# Patient Record
Sex: Male | Born: 1950
Health system: Southern US, Community
[De-identification: ages and names within clinical notes are randomized; demographics above are authoritative.]

## PROBLEM LIST (undated history)

## (undated) DIAGNOSIS — C801 Malignant (primary) neoplasm, unspecified: Secondary | ICD-10-CM

## (undated) DIAGNOSIS — I10 Essential (primary) hypertension: Secondary | ICD-10-CM

## (undated) DIAGNOSIS — R7303 Prediabetes: Secondary | ICD-10-CM

## (undated) DIAGNOSIS — Z8489 Family history of other specified conditions: Secondary | ICD-10-CM

## (undated) DIAGNOSIS — K219 Gastro-esophageal reflux disease without esophagitis: Secondary | ICD-10-CM

## (undated) DIAGNOSIS — J45909 Unspecified asthma, uncomplicated: Secondary | ICD-10-CM

## (undated) HISTORY — PX: COLONOSCOPY: SHX174

## (undated) HISTORY — PX: NASAL SINUS SURGERY: SHX719

---

## 1999-03-31 ENCOUNTER — Other Ambulatory Visit: Admission: RE | Admit: 1999-03-31 | Discharge: 1999-03-31 | Payer: Self-pay | Admitting: *Deleted

## 2011-05-25 ENCOUNTER — Other Ambulatory Visit: Payer: Self-pay | Admitting: Otolaryngology

## 2011-05-25 DIAGNOSIS — J329 Chronic sinusitis, unspecified: Secondary | ICD-10-CM

## 2011-06-01 ENCOUNTER — Ambulatory Visit
Admission: RE | Admit: 2011-06-01 | Discharge: 2011-06-01 | Disposition: A | Discharge status: Home / Self Care | Payer: Managed Care, Other (non HMO) | Source: Ambulatory Visit | Attending: Otolaryngology | Admitting: Otolaryngology

## 2011-06-01 DIAGNOSIS — J329 Chronic sinusitis, unspecified: Secondary | ICD-10-CM

## 2011-06-05 ENCOUNTER — Other Ambulatory Visit: Payer: Self-pay | Admitting: Otolaryngology

## 2012-02-03 HISTORY — PX: BUNIONECTOMY: SHX129

## 2013-12-17 IMAGING — CT CT PARANASAL SINUSES LIMITED
1 series · 10 of 12 positions shown, 13 images · non-contrast
Comparison: 02/25/2006.

CLINICAL DATA: History of ear infection, laryngitis, congestion
and drainage.

CT LIMITED SINUSES WITHOUT CONTRAST
TECHNIQUE: Multidetector CT images of the paranasal sinuses were
obtained in a single plane without contrast.

[Series 3: coronal soft · axial · 0.33mm/px · z∈[+13,+103]mm · 10 of 12 slices shown, 13 images]
[im 2/12  brain]
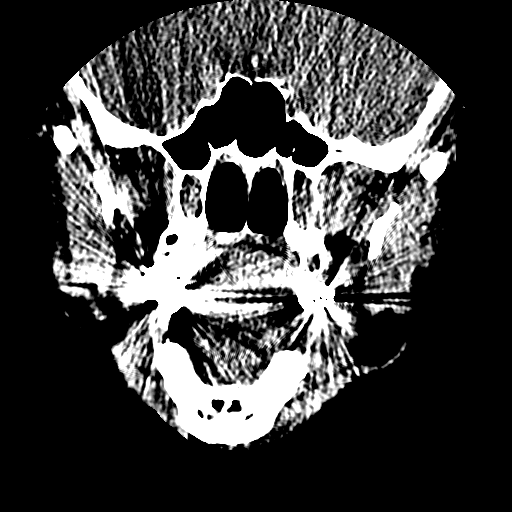
[im 2/12  bone]
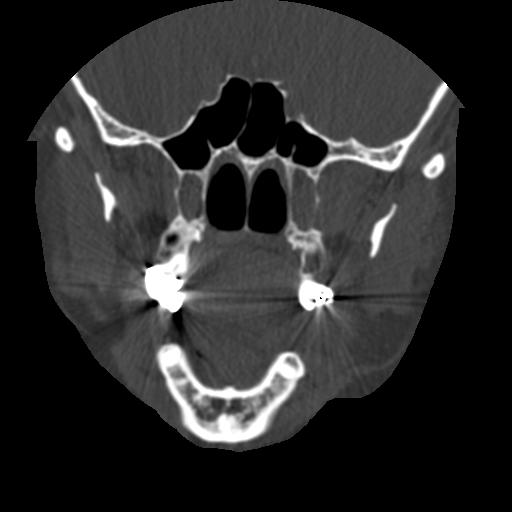
[im 3/12  bone]
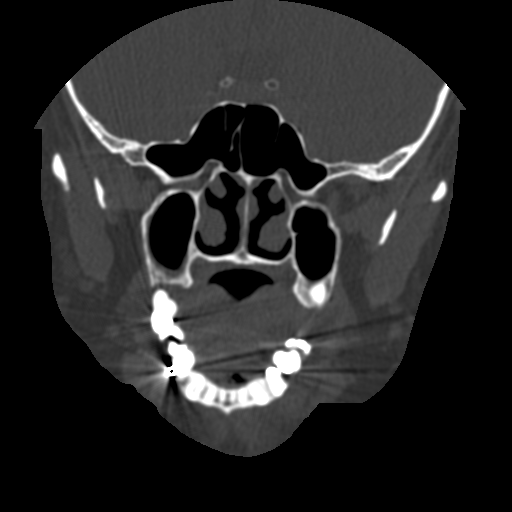
[im 4/12  bone]
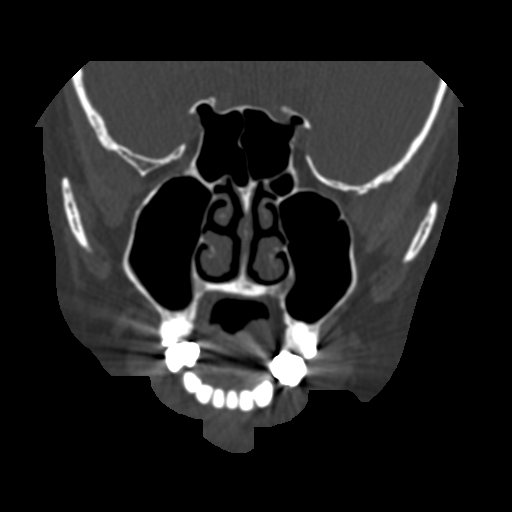
[im 5/12  bone]
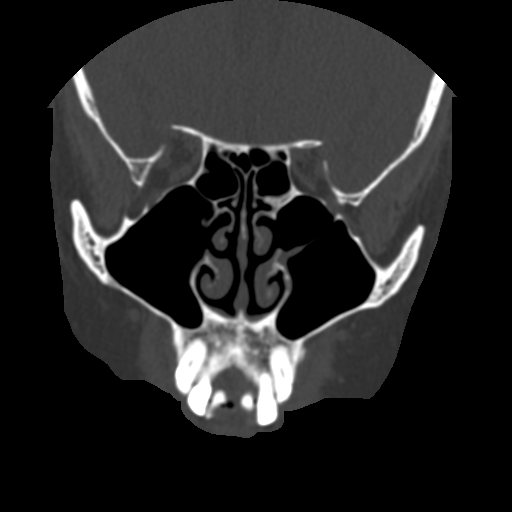
[im 6/12  brain]
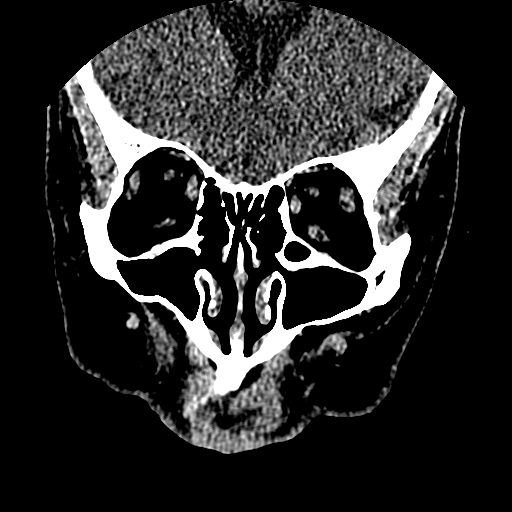
[im 6/12  bone]
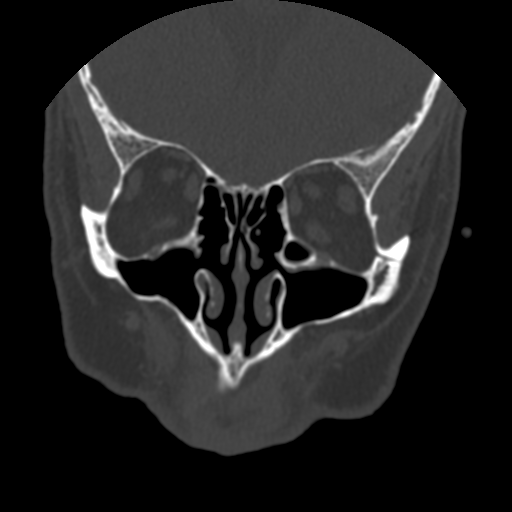
[im 7/12  bone]
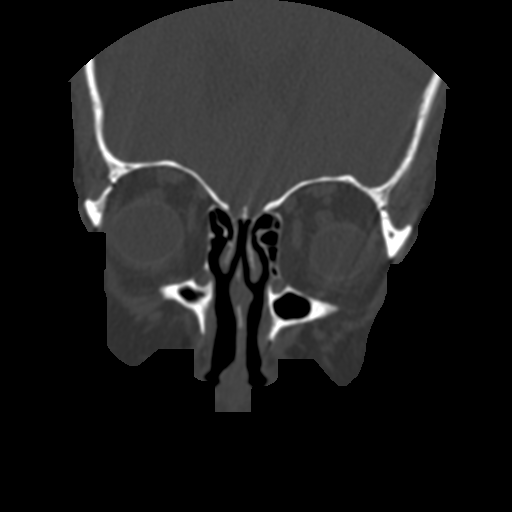
[im 8/12  bone]
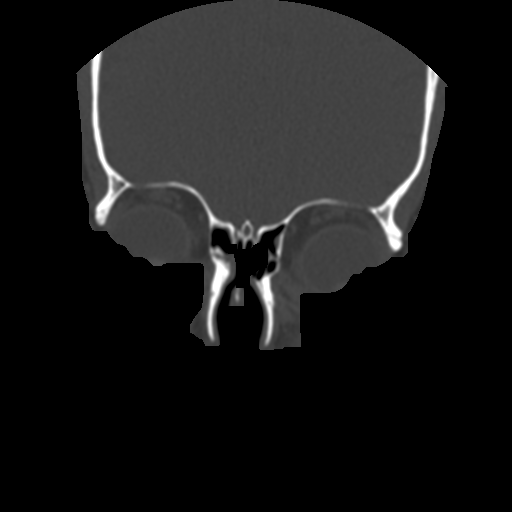
[im 9/12  bone]
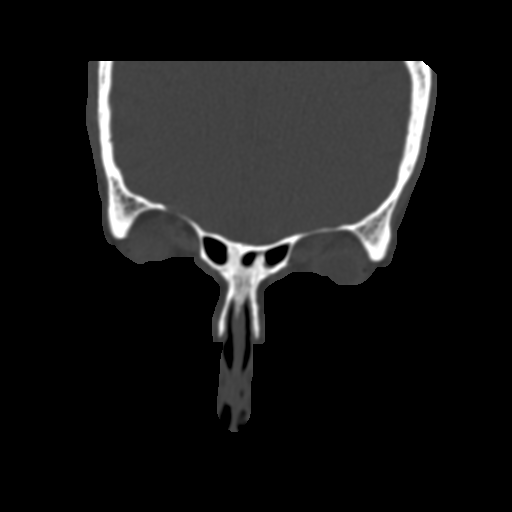
[im 10/12  brain]
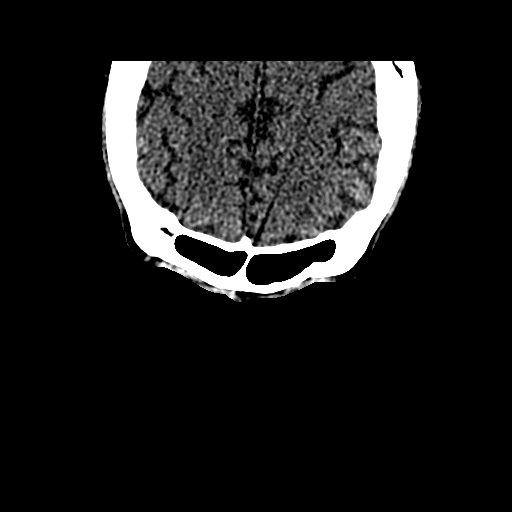
[im 10/12  bone]
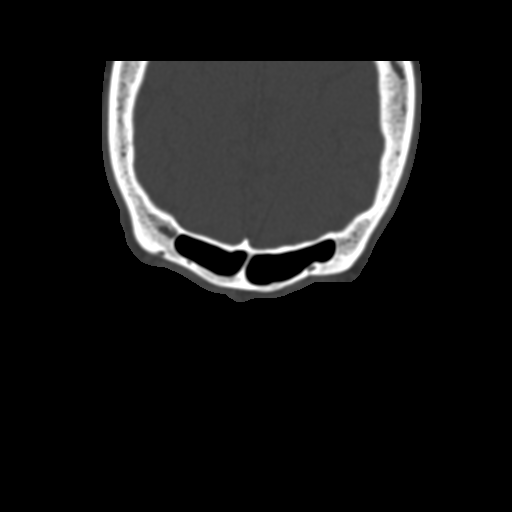
[im 11/12  bone]
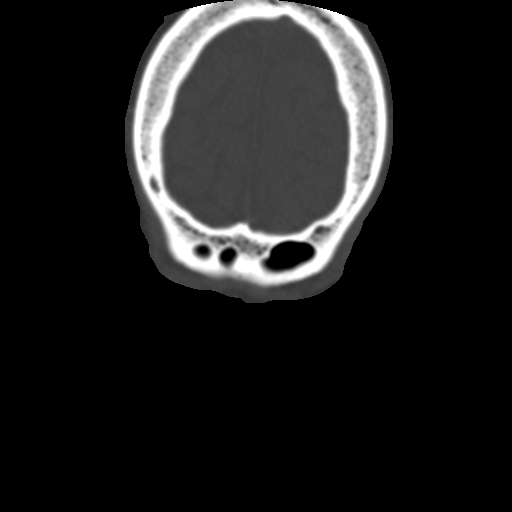

[10 of 12 positions shown; findings below may reference images not displayed]

FINDINGS: Trace mucosal thickening in the ethmoid air cells and
maxillary sinuses.  No air fluid levels.  Incidental imaging of the
intracranial contents shows no acute findings.
IMPRESSION: Trace mucosal thickening scattered in the ethmoid air cells and
maxillary sinuses.  No air fluid levels.

## 2017-06-02 DIAGNOSIS — L820 Inflamed seborrheic keratosis: Secondary | ICD-10-CM | POA: Diagnosis not present

## 2017-06-02 DIAGNOSIS — D485 Neoplasm of uncertain behavior of skin: Secondary | ICD-10-CM | POA: Diagnosis not present

## 2017-06-02 DIAGNOSIS — D225 Melanocytic nevi of trunk: Secondary | ICD-10-CM | POA: Diagnosis not present

## 2017-06-02 DIAGNOSIS — L578 Other skin changes due to chronic exposure to nonionizing radiation: Secondary | ICD-10-CM | POA: Diagnosis not present

## 2017-06-02 DIAGNOSIS — L821 Other seborrheic keratosis: Secondary | ICD-10-CM | POA: Diagnosis not present

## 2017-09-14 DIAGNOSIS — J329 Chronic sinusitis, unspecified: Secondary | ICD-10-CM | POA: Diagnosis not present

## 2017-09-14 DIAGNOSIS — J309 Allergic rhinitis, unspecified: Secondary | ICD-10-CM | POA: Diagnosis not present

## 2017-09-15 DIAGNOSIS — L720 Epidermal cyst: Secondary | ICD-10-CM | POA: Diagnosis not present

## 2017-10-25 DIAGNOSIS — M21622 Bunionette of left foot: Secondary | ICD-10-CM | POA: Diagnosis not present

## 2017-10-25 DIAGNOSIS — E139 Other specified diabetes mellitus without complications: Secondary | ICD-10-CM | POA: Diagnosis not present

## 2017-10-25 DIAGNOSIS — D2372 Other benign neoplasm of skin of left lower limb, including hip: Secondary | ICD-10-CM | POA: Diagnosis not present

## 2017-10-25 DIAGNOSIS — M21962 Unspecified acquired deformity of left lower leg: Secondary | ICD-10-CM | POA: Diagnosis not present

## 2017-11-08 DIAGNOSIS — K219 Gastro-esophageal reflux disease without esophagitis: Secondary | ICD-10-CM | POA: Diagnosis not present

## 2017-12-03 DIAGNOSIS — J45909 Unspecified asthma, uncomplicated: Secondary | ICD-10-CM | POA: Diagnosis not present

## 2017-12-03 DIAGNOSIS — E119 Type 2 diabetes mellitus without complications: Secondary | ICD-10-CM | POA: Diagnosis not present

## 2017-12-03 DIAGNOSIS — Z86010 Personal history of colonic polyps: Secondary | ICD-10-CM | POA: Diagnosis not present

## 2017-12-03 DIAGNOSIS — Z800 Family history of malignant neoplasm of digestive organs: Secondary | ICD-10-CM | POA: Diagnosis not present

## 2017-12-03 DIAGNOSIS — Z1211 Encounter for screening for malignant neoplasm of colon: Secondary | ICD-10-CM | POA: Diagnosis not present

## 2017-12-03 DIAGNOSIS — K219 Gastro-esophageal reflux disease without esophagitis: Secondary | ICD-10-CM | POA: Diagnosis not present

## 2017-12-03 DIAGNOSIS — K635 Polyp of colon: Secondary | ICD-10-CM | POA: Diagnosis not present

## 2017-12-03 DIAGNOSIS — D124 Benign neoplasm of descending colon: Secondary | ICD-10-CM | POA: Diagnosis not present

## 2017-12-03 DIAGNOSIS — D122 Benign neoplasm of ascending colon: Secondary | ICD-10-CM | POA: Diagnosis not present

## 2017-12-03 DIAGNOSIS — I10 Essential (primary) hypertension: Secondary | ICD-10-CM | POA: Diagnosis not present

## 2017-12-03 DIAGNOSIS — M1990 Unspecified osteoarthritis, unspecified site: Secondary | ICD-10-CM | POA: Diagnosis not present

## 2017-12-03 DIAGNOSIS — Z7951 Long term (current) use of inhaled steroids: Secondary | ICD-10-CM | POA: Diagnosis not present

## 2017-12-03 DIAGNOSIS — E7800 Pure hypercholesterolemia, unspecified: Secondary | ICD-10-CM | POA: Diagnosis not present

## 2017-12-03 DIAGNOSIS — Z79899 Other long term (current) drug therapy: Secondary | ICD-10-CM | POA: Diagnosis not present

## 2017-12-29 DIAGNOSIS — Z23 Encounter for immunization: Secondary | ICD-10-CM | POA: Diagnosis not present

## 2018-01-17 DIAGNOSIS — Z0000 Encounter for general adult medical examination without abnormal findings: Secondary | ICD-10-CM | POA: Diagnosis not present

## 2018-01-17 DIAGNOSIS — R319 Hematuria, unspecified: Secondary | ICD-10-CM | POA: Diagnosis not present

## 2018-01-18 DIAGNOSIS — E785 Hyperlipidemia, unspecified: Secondary | ICD-10-CM | POA: Diagnosis not present

## 2018-01-18 DIAGNOSIS — E1169 Type 2 diabetes mellitus with other specified complication: Secondary | ICD-10-CM | POA: Diagnosis not present

## 2018-01-18 DIAGNOSIS — Z79899 Other long term (current) drug therapy: Secondary | ICD-10-CM | POA: Diagnosis not present

## 2018-02-01 DIAGNOSIS — E1165 Type 2 diabetes mellitus with hyperglycemia: Secondary | ICD-10-CM | POA: Diagnosis not present

## 2018-02-01 DIAGNOSIS — R740 Nonspecific elevation of levels of transaminase and lactic acid dehydrogenase [LDH]: Secondary | ICD-10-CM | POA: Diagnosis not present

## 2018-02-01 DIAGNOSIS — R319 Hematuria, unspecified: Secondary | ICD-10-CM | POA: Diagnosis not present

## 2018-02-16 DIAGNOSIS — M79622 Pain in left upper arm: Secondary | ICD-10-CM | POA: Diagnosis not present

## 2018-02-16 DIAGNOSIS — B372 Candidiasis of skin and nail: Secondary | ICD-10-CM | POA: Diagnosis not present

## 2018-02-16 DIAGNOSIS — E119 Type 2 diabetes mellitus without complications: Secondary | ICD-10-CM | POA: Diagnosis not present

## 2018-02-22 DIAGNOSIS — M79622 Pain in left upper arm: Secondary | ICD-10-CM | POA: Diagnosis not present

## 2018-02-22 DIAGNOSIS — D360 Benign neoplasm of lymph nodes: Secondary | ICD-10-CM | POA: Diagnosis not present

## 2018-03-04 DIAGNOSIS — R310 Gross hematuria: Secondary | ICD-10-CM | POA: Diagnosis not present

## 2018-03-08 DIAGNOSIS — R310 Gross hematuria: Secondary | ICD-10-CM | POA: Diagnosis not present

## 2018-03-10 DIAGNOSIS — H5201 Hypermetropia, right eye: Secondary | ICD-10-CM | POA: Diagnosis not present

## 2018-03-10 DIAGNOSIS — E119 Type 2 diabetes mellitus without complications: Secondary | ICD-10-CM | POA: Diagnosis not present

## 2018-04-07 DIAGNOSIS — R310 Gross hematuria: Secondary | ICD-10-CM | POA: Diagnosis not present

## 2018-04-13 ENCOUNTER — Other Ambulatory Visit: Payer: Self-pay | Admitting: Urology

## 2018-04-13 MED ORDER — GEMCITABINE CHEMO FOR BLADDER INSTILLATION 2000 MG: 2000.0000 mg | Freq: Once | INTRAVENOUS | Status: DC

## 2018-04-20 ENCOUNTER — Other Ambulatory Visit: Payer: Self-pay

## 2018-04-20 ENCOUNTER — Encounter (HOSPITAL_BASED_OUTPATIENT_CLINIC_OR_DEPARTMENT_OTHER): Payer: Self-pay

## 2018-04-20 NOTE — Progress Notes (Signed)
Attempted to contact patient  to review medical/surgical history and discuss instructions fore upcoming surgery.   Patient at work and gave permission to discuss information with wife Diane.  Medical/surgical hx reviewed.  Instructed NPO after MN am of surgery with exception of alfuzosin and symbicort with sip of water.  To arrive to [facility] at 645am with photo ID and insurance card.  Pre- and postop expectations reviewed.  Verbalized understanding.

## 2018-04-28 ENCOUNTER — Other Ambulatory Visit: Payer: Self-pay

## 2018-04-28 ENCOUNTER — Encounter (HOSPITAL_COMMUNITY)
Admission: RE | Disposition: A | Discharge status: Home / Self Care | Payer: Self-pay | Source: Home / Self Care | Attending: Urology

## 2018-04-28 ENCOUNTER — Ambulatory Visit (HOSPITAL_COMMUNITY): Payer: Medicare Other

## 2018-04-28 ENCOUNTER — Encounter (HOSPITAL_COMMUNITY): Payer: Self-pay | Admitting: Emergency Medicine

## 2018-04-28 ENCOUNTER — Ambulatory Visit (HOSPITAL_COMMUNITY): Payer: Medicare Other | Admitting: Certified Registered Nurse Anesthetist

## 2018-04-28 ENCOUNTER — Ambulatory Visit (HOSPITAL_COMMUNITY)
Admission: RE | Admit: 2018-04-28 | Discharge: 2018-04-28 | Disposition: A | Discharge status: Home / Self Care | Payer: Medicare Other | Attending: Urology | Admitting: Urology

## 2018-04-28 DIAGNOSIS — C672 Malignant neoplasm of lateral wall of bladder: Secondary | ICD-10-CM | POA: Insufficient documentation

## 2018-04-28 DIAGNOSIS — I10 Essential (primary) hypertension: Secondary | ICD-10-CM | POA: Diagnosis not present

## 2018-04-28 DIAGNOSIS — Z87891 Personal history of nicotine dependence: Secondary | ICD-10-CM | POA: Diagnosis not present

## 2018-04-28 DIAGNOSIS — R310 Gross hematuria: Secondary | ICD-10-CM | POA: Diagnosis present

## 2018-04-28 DIAGNOSIS — D494 Neoplasm of unspecified behavior of bladder: Secondary | ICD-10-CM | POA: Diagnosis not present

## 2018-04-28 DIAGNOSIS — C679 Malignant neoplasm of bladder, unspecified: Secondary | ICD-10-CM | POA: Diagnosis not present

## 2018-04-28 HISTORY — DX: Essential (primary) hypertension: I10

## 2018-04-28 HISTORY — DX: Malignant (primary) neoplasm, unspecified: C80.1

## 2018-04-28 HISTORY — DX: Prediabetes: R73.03

## 2018-04-28 HISTORY — DX: Unspecified asthma, uncomplicated: J45.909

## 2018-04-28 HISTORY — DX: Gastro-esophageal reflux disease without esophagitis: K21.9

## 2018-04-28 HISTORY — PX: TRANSURETHRAL RESECTION OF BLADDER TUMOR WITH MITOMYCIN-C: SHX6459

## 2018-04-28 HISTORY — PX: CYSTOSCOPY W/ RETROGRADES: SHX1426

## 2018-04-28 LAB — BASIC METABOLIC PANEL
Anion gap: 8 (ref 5–15)
BUN: 15 mg/dL (ref 8–23)
CHLORIDE: 105 mmol/L (ref 98–111)
CO2: 24 mmol/L (ref 22–32)
CREATININE: 0.66 mg/dL (ref 0.61–1.24)
Calcium: 9.1 mg/dL (ref 8.9–10.3)
GFR calc Af Amer: >60 mL/min (ref >60)
GFR calc non Af Amer: >60 mL/min (ref >60)
Glucose, Bld: 159 mg/dL — ABNORMAL HIGH (ref 70–99)
Potassium: 4 mmol/L (ref 3.5–5.1)
Sodium: 137 mmol/L (ref 135–145)

## 2018-04-28 LAB — GLUCOSE, CAPILLARY: Glucose-Capillary: 152 mg/dL — ABNORMAL HIGH (ref 70–99)

## 2018-04-28 SURGERY — TRANSURETHRAL RESECTION OF BLADDER TUMOR WITH MITOMYCIN-C
Anesthesia: General

## 2018-04-28 MED ORDER — PROPOFOL 10 MG/ML IV BOLUS
INTRAVENOUS | Status: DC | PRN
  Administered 2018-04-28: 140 mg via INTRAVENOUS

## 2018-04-28 MED ORDER — ROCURONIUM BROMIDE 10 MG/ML (PF) SYRINGE
PREFILLED_SYRINGE | INTRAVENOUS | Status: DC | PRN
  Administered 2018-04-28: 50 mg via INTRAVENOUS

## 2018-04-28 MED ORDER — LACTATED RINGERS IV SOLN: INTRAVENOUS | Status: DC

## 2018-04-28 MED ORDER — MIDAZOLAM HCL 2 MG/2ML IJ SOLN
INTRAMUSCULAR | Status: AC
  Filled 2018-04-28: qty 2

## 2018-04-28 MED ORDER — DEXAMETHASONE SODIUM PHOSPHATE 4 MG/ML IJ SOLN
INTRAMUSCULAR | Status: DC | PRN
  Administered 2018-04-28: 10 mg via INTRAVENOUS

## 2018-04-28 MED ORDER — OXYCODONE HCL 5 MG PO TABS
5.0000 mg | ORAL_TABLET | Freq: Once | ORAL | Status: AC | PRN
  Administered 2018-04-28: 5 mg via ORAL

## 2018-04-28 MED ORDER — SUCCINYLCHOLINE CHLORIDE 200 MG/10ML IV SOSY
PREFILLED_SYRINGE | INTRAVENOUS | Status: DC | PRN
  Administered 2018-04-28: 120 mg via INTRAVENOUS

## 2018-04-28 MED ORDER — BELLADONNA ALKALOIDS-OPIUM 16.2-30 MG RE SUPP
RECTAL | Status: AC
  Filled 2018-04-28: qty 1

## 2018-04-28 MED ORDER — LIDOCAINE 2% (20 MG/ML) 5 ML SYRINGE
INTRAMUSCULAR | Status: DC | PRN
  Administered 2018-04-28: 60 mg via INTRAVENOUS

## 2018-04-28 MED ORDER — ONDANSETRON HCL 4 MG/2ML IJ SOLN: 4.0000 mg | Freq: Four times a day (QID) | INTRAMUSCULAR | Status: DC | PRN

## 2018-04-28 MED ORDER — SODIUM CHLORIDE 0.9 % IR SOLN
Status: DC | PRN
  Administered 2018-04-28 (×2): 3000 mL via INTRAVESICAL

## 2018-04-28 MED ORDER — CEFAZOLIN SODIUM-DEXTROSE 2-4 GM/100ML-% IV SOLN
2.0000 g | INTRAVENOUS | Status: AC
  Administered 2018-04-28: 2 g via INTRAVENOUS
  Filled 2018-04-28: qty 100

## 2018-04-28 MED ORDER — FENTANYL CITRATE (PF) 100 MCG/2ML IJ SOLN
INTRAMUSCULAR | Status: AC
  Filled 2018-04-28: qty 2

## 2018-04-28 MED ORDER — STERILE WATER FOR IRRIGATION IR SOLN: Status: DC | PRN

## 2018-04-28 MED ORDER — ONDANSETRON HCL 4 MG/2ML IJ SOLN
INTRAMUSCULAR | Status: AC
  Filled 2018-04-28: qty 2

## 2018-04-28 MED ORDER — PROPOFOL 10 MG/ML IV BOLUS
INTRAVENOUS | Status: AC
  Filled 2018-04-28: qty 40

## 2018-04-28 MED ORDER — FENTANYL CITRATE (PF) 100 MCG/2ML IJ SOLN
25.0000 ug | INTRAMUSCULAR | Status: DC | PRN
  Administered 2018-04-28 (×3): 50 ug via INTRAVENOUS

## 2018-04-28 MED ORDER — LACTATED RINGERS IV SOLN
INTRAVENOUS | Status: DC
  Administered 2018-04-28: 09:00:00 via INTRAVENOUS

## 2018-04-28 MED ORDER — HYDROMORPHONE HCL 1 MG/ML IJ SOLN
INTRAMUSCULAR | Status: AC
  Filled 2018-04-28: qty 1

## 2018-04-28 MED ORDER — ROCURONIUM BROMIDE 10 MG/ML (PF) SYRINGE
PREFILLED_SYRINGE | INTRAVENOUS | Status: AC
  Filled 2018-04-28: qty 10

## 2018-04-28 MED ORDER — SUCCINYLCHOLINE CHLORIDE 200 MG/10ML IV SOSY
PREFILLED_SYRINGE | INTRAVENOUS | Status: AC
  Filled 2018-04-28: qty 10

## 2018-04-28 MED ORDER — HYDROMORPHONE HCL 1 MG/ML IJ SOLN
0.2500 mg | INTRAMUSCULAR | Status: DC | PRN
  Administered 2018-04-28 (×2): 0.5 mg via INTRAVENOUS

## 2018-04-28 MED ORDER — SUGAMMADEX SODIUM 200 MG/2ML IV SOLN
INTRAVENOUS | Status: AC
  Filled 2018-04-28: qty 2

## 2018-04-28 MED ORDER — HYDROCODONE-ACETAMINOPHEN 5-325 MG PO TABS: 1.0000 | ORAL_TABLET | ORAL | 0 refills | Status: DC | PRN

## 2018-04-28 MED ORDER — SUGAMMADEX SODIUM 200 MG/2ML IV SOLN
INTRAVENOUS | Status: DC | PRN
  Administered 2018-04-28: 200 mg via INTRAVENOUS

## 2018-04-28 MED ORDER — OXYCODONE HCL 5 MG/5ML PO SOLN: 5.0000 mg | Freq: Once | ORAL | Status: AC | PRN

## 2018-04-28 MED ORDER — DEXAMETHASONE SODIUM PHOSPHATE 10 MG/ML IJ SOLN
INTRAMUSCULAR | Status: AC
  Filled 2018-04-28: qty 1

## 2018-04-28 MED ORDER — FENTANYL CITRATE (PF) 100 MCG/2ML IJ SOLN
INTRAMUSCULAR | Status: DC | PRN
  Administered 2018-04-28 (×4): 50 ug via INTRAVENOUS

## 2018-04-28 MED ORDER — LIDOCAINE 2% (20 MG/ML) 5 ML SYRINGE
INTRAMUSCULAR | Status: AC
  Filled 2018-04-28: qty 5

## 2018-04-28 MED ORDER — GEMCITABINE CHEMO FOR BLADDER INSTILLATION 2000 MG
2000.0000 mg | Freq: Once | INTRAVENOUS | Status: AC
  Administered 2018-04-28: 2000 mg via INTRAVESICAL
  Filled 2018-04-28: qty 2000

## 2018-04-28 MED ORDER — OXYCODONE HCL 5 MG PO TABS
ORAL_TABLET | ORAL | Status: AC
  Filled 2018-04-28: qty 1

## 2018-04-28 MED ORDER — ONDANSETRON HCL 4 MG/2ML IJ SOLN
INTRAMUSCULAR | Status: DC | PRN
  Administered 2018-04-28: 4 mg via INTRAVENOUS

## 2018-04-28 MED ORDER — LIDOCAINE HCL URETHRAL/MUCOSAL 2 % EX GEL
CUTANEOUS | Status: AC
  Filled 2018-04-28: qty 5

## 2018-04-28 MED ORDER — IOHEXOL 300 MG/ML  SOLN
INTRAMUSCULAR | Status: DC | PRN
  Administered 2018-04-28: 12 mL

## 2018-04-28 MED ORDER — MIDAZOLAM HCL 5 MG/5ML IJ SOLN
INTRAMUSCULAR | Status: DC | PRN
  Administered 2018-04-28: 2 mg via INTRAVENOUS

## 2018-04-28 SURGICAL SUPPLY — 29 items
BAG URINE DRAINAGE (UROLOGICAL SUPPLIES) IMPLANT
BAG URO CATCHER STRL LF (MISCELLANEOUS) ×4 IMPLANT
CATH INTERMIT  6FR 70CM (CATHETERS) ×4 IMPLANT
CLOTH BEACON ORANGE TIMEOUT ST (SAFETY) ×4 IMPLANT
COVER WAND RF STERILE (DRAPES) IMPLANT
ELECT REM PT RETURN 15FT ADLT (MISCELLANEOUS) ×4 IMPLANT
EVACUATOR MICROVAS BLADDER (UROLOGICAL SUPPLIES) IMPLANT
EXTRACTOR STONE NITINOL NGAGE (UROLOGICAL SUPPLIES) IMPLANT
FIBER LASER TRAC TIP (UROLOGICAL SUPPLIES) IMPLANT
GLOVE BIO SURGEON STRL SZ8 (GLOVE) ×4 IMPLANT
GLOVE BIOGEL M 8.0 STRL (GLOVE) ×4 IMPLANT
GOWN STRL REUS W/TWL LRG LVL3 (GOWN DISPOSABLE) ×4 IMPLANT
GOWN STRL REUS W/TWL XL LVL3 (GOWN DISPOSABLE) ×4 IMPLANT
GUIDEWIRE ANG ZIPWIRE 038X150 (WIRE) ×4 IMPLANT
GUIDEWIRE STR DUAL SENSOR (WIRE) IMPLANT
IV NS 1000ML (IV SOLUTION) ×2
IV NS 1000ML BAXH (IV SOLUTION) ×2 IMPLANT
KIT TURNOVER KIT A (KITS) IMPLANT
LOOP CUT BIPOLAR 24F LRG (ELECTROSURGICAL) IMPLANT
MANIFOLD NEPTUNE II (INSTRUMENTS) ×4 IMPLANT
PACK CYSTO (CUSTOM PROCEDURE TRAY) ×4 IMPLANT
SET ASPIRATION TUBING (TUBING) IMPLANT
SHEATH URETERAL 12FRX35CM (MISCELLANEOUS) IMPLANT
STENT URET 6FRX26 CONTOUR (STENTS) IMPLANT
SYRINGE IRR TOOMEY STRL 70CC (SYRINGE) IMPLANT
TUBE FEEDING 8FR 16IN STR KANG (MISCELLANEOUS) IMPLANT
TUBING CONNECTING 10 (TUBING) ×3 IMPLANT
TUBING CONNECTING 10' (TUBING) ×1
TUBING UROLOGY SET (TUBING) ×4 IMPLANT

## 2018-04-28 NOTE — Discharge Instructions (Signed)

## 2018-04-28 NOTE — H&P (Signed)
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 He denies any worsening LUTS. No recent gross hematuria. No fevers/chills/sweats  Past Medical History:  Diagnosis Date  . Asthma    bronchial-well controlled  . Cancer [facility])    Bladder  . GERD (gastroesophageal reflux disease)    mild, on no preventative meds  . Hypertension   . Pre-diabetes    will be started on meds likely at next md vist, A1C 8.8   Past Surgical History:  Procedure Laterality Date  . BUNIONECTOMY Right 2014  . COLONOSCOPY  2009,2014,2019  . NASAL SINUS SURGERY  1990s    Home Medications:  Current Facility-Administered Medications  Medication Dose Route Frequency Provider Last Rate Last Dose  . ceFAZolin (ANCEF) IVPB 2g/100 mL premix  2 g Intravenous 30 min Pre-Op Ki Corbo, Candee Furbish, MD      . lactated ringers infusion   Intravenous Continuous Oleta Mouse, MD       Facility-Administered Medications Ordered in Other Encounters  Medication Dose Route Frequency Provider Last Rate Last Dose  . gemcitabine (GEMZAR) chemo syringe for bladder instillation 2,000 mg  2,000 mg Bladder Instillation Once Alyson Ingles Candee Furbish, MD       Allergies:  Allergies  Allergen Reactions  . Augmentin [Amoxicillin-Pot Clavulanate] Diarrhea and Nausea And Vomiting    Did it involve swelling of the face/tongue/throat, SOB, or low BP? No Did it involve sudden or severe rash/hives, skin peeling, or any reaction on the inside of your mouth or nose? No Did you need to seek medical attention at a hospital or doctor's office? No When did it last happen?3-4 years ago If all above answers are "NO", may proceed with cephalosporin use.     History reviewed. No pertinent family history. Social History:  reports that he quit smoking about 35 years ago. His smoking use included  cigarettes. He has a 3.50 pack-year smoking history. He does not have any smokeless tobacco history on file. He reports that he does not use drugs. No history on file for alcohol.  Review of Systems  Genitourinary: Positive for hematuria.  All other systems reviewed and are negative.   Physical Exam:  Vital signs in last 24 hours: Temp:  [98.4 F (36.9 C)] 98.4 F (36.9 C) (03/26 0721) Pulse Rate:  [62] 62 (03/26 0721) Resp:  [18] 18 (03/26 0721) BP: (146)/(90) 146/90 (03/26 0721) SpO2:  [96 %] 96 % (03/26 0721) Weight:  [84.4 kg] 84.4 kg (03/26 0721) Physical Exam  Constitutional: He is oriented to person, place, and time. He appears well-developed and well-nourished.  HENT:  Head: Normocephalic and atraumatic.  Eyes: Pupils are equal, round, and reactive to light. EOM are normal.  Neck: Normal range of motion. No thyromegaly present.  Cardiovascular: Normal rate and regular rhythm.  Respiratory: Effort normal. No respiratory distress.  GI: Soft. He exhibits no distension.  Musculoskeletal: Normal range of motion.        General: No edema.  Neurological: He is alert and oriented to person, place, and time.  Skin: Skin is warm and dry.  Psychiatric: He has a normal mood and affect. His behavior is normal. Judgment and thought content normal.    Laboratory Data:  Results for orders placed or performed during the hospital encounter of 04/28/18 (from the past 24 hour(s))  Glucose, capillary  Status: Abnormal   Collection Time: 04/28/18  7:25 AM  Result Value Ref Range   Glucose-Capillary 152 (H) 70 - 99 mg/dL  Basic metabolic panel     Status: Abnormal   Collection Time: 04/28/18  7:30 AM  Result Value Ref Range   Sodium 137 135 - 145 mmol/L   Potassium 4.0 3.5 - 5.1 mmol/L   Chloride 105 98 - 111 mmol/L   CO2 24 22 - 32 mmol/L   Glucose, Bld 159 (H) 70 - 99 mg/dL   BUN 15 8 - 23 mg/dL   Creatinine, Ser 0.66 0.61 - 1.24 mg/dL   Calcium 9.1 8.9 - 10.3 mg/dL   GFR calc  non Af Amer >60 >60 mL/min   GFR calc Af Amer >60 >60 mL/min   Anion gap 8 5 - 15   No results found for this or any previous visit (from the past 240 hour(s)). Creatinine: Recent Labs    04/28/18 0730  CREATININE 0.66   Baseline Creatinine: 0.66  Impression/Assessment:  67yo with a bladder tumor  Plan:  The risks/benefits/alternatives to bladder tumor resection, bilateral retrogrades and gemcitibine instillation was explained to the patient and he understands and wishes to proceed with surgery  Daniel Farley 04/28/2018, 8:42 AM

## 2018-04-28 NOTE — Anesthesia Procedure Notes (Signed)
Procedure Name: Intubation Date/Time: 04/28/2018 8:56 AM Performed by: Claudia Desanctis, CRNA Pre-anesthesia Checklist: Patient identified, Emergency Drugs available, Suction available and Patient being monitored Patient Re-evaluated:Patient Re-evaluated prior to induction Oxygen Delivery Method: Circle system utilized Preoxygenation: Pre-oxygenation with 100% oxygen Induction Type: IV induction and Rapid sequence Laryngoscope Size: 2 and Miller Grade View: Grade I Tube type: Oral Tube size: 7.5 mm Number of attempts: 1 Airway Equipment and Method: Stylet Placement Confirmation: ETT inserted through vocal cords under direct vision,  positive ETCO2 and breath sounds checked- equal and bilateral Secured at: 22 cm Tube secured with: Tape Dental Injury: Teeth and Oropharynx as per pre-operative assessment

## 2018-04-28 NOTE — Anesthesia Preprocedure Evaluation (Signed)
Anesthesia Evaluation  Patient identified by MRN, date of birth, ID band Patient awake    Reviewed: Allergy & Precautions, H&P , NPO status , Patient's Chart, lab work & pertinent test results  Airway Mallampati: II   Neck ROM: full    Dental   Pulmonary asthma , former smoker,    breath sounds clear to auscultation       Cardiovascular hypertension,  Rhythm:regular Rate:Normal     Neuro/Psych    GI/Hepatic GERD  ,  Endo/Other    Renal/GU    Bladder CA    Musculoskeletal   Abdominal   Peds  Hematology   Anesthesia Other Findings   Reproductive/Obstetrics                             Anesthesia Physical Anesthesia Plan  ASA: II  Anesthesia Plan: General   Post-op Pain Management:    Induction: Intravenous  PONV Risk Score and Plan: 2 and Ondansetron, Dexamethasone, Midazolam and Treatment may vary due to age or medical condition  Airway Management Planned: Oral ETT  Additional Equipment:   Intra-op Plan:   Post-operative Plan: Extubation in OR  Informed Consent: I have reviewed the patients History and Physical, chart, labs and discussed the procedure including the risks, benefits and alternatives for the proposed anesthesia with the patient or authorized representative who has indicated his/her understanding and acceptance.       Plan Discussed with: CRNA, Anesthesiologist and Surgeon  Anesthesia Plan Comments:         Anesthesia Quick Evaluation

## 2018-04-28 NOTE — Transfer of Care (Signed)
Immediate Anesthesia Transfer of Care Note  Patient: Daniel Farley  Procedure(s) Performed: TRANSURETHRAL RESECTION OF BLADDER TUMOR WITH MITOMYCIN-C (N/A ) CYSTOSCOPY WITH RETROGRADE PYELOGRAM (Bilateral )  Patient Location: PACU  Anesthesia Type:General  Level of Consciousness: awake alert  Airway & Oxygen Therapy: Patient Spontanous Breathing and Patient connected to face mask  Post-op Assessment: Report given to RN and Post -op Vital signs reviewed and stable  Post vital signs: Reviewed and stable  Last Vitals:  Vitals Value Taken Time  BP    Temp    Pulse 72 04/28/2018  9:57 AM  Resp    SpO2 100 % 04/28/2018  9:57 AM  Vitals shown include unvalidated device data.  Last Pain:  Vitals:   04/28/18 0735  TempSrc:   PainSc: 0-No pain         Complications: No apparent anesthesia complications

## 2018-04-28 NOTE — Op Note (Signed)
.  Preoperative diagnosis: bladder tumor  Postoperative diagnosis: Same  Procedure: 1 cystoscopy 2. bilateral retrograde pyelography 3.  Intraoperative fluoroscopy, under one hour, with interpretation 4.  Transurethral resection of bladder tumor, large  Attending: Rosie Fate  Anesthesia: General  Estimated blood loss: Minimal  Drains: 22 French foley  Specimens: anterior bladder wall tumor  Antibiotics: ancef  Findings: 5cm papillary left lateral wall tumor.  Ureteral orifices in normal anatomic location. No hydronephrosis or filling defects in either collecting system  Indications: Patient is a 68 year old male with a history of bladder tumor and gross hematuria.  After discussing treatment options, they decided proceed with transurethral resection of a bladder tumor.  Procedure her in detail: The patient was brought to the operating room and a brief timeout was done to ensure correct patient, correct procedure, correct site.  General anesthesia was administered patient was placed in dorsal lithotomy position.  Their genitalia was then prepped and draped in usual sterile fashion.  A rigid 55 French cystoscope was passed in the urethra and the bladder.  Bladder was inspected and we noted a large amount of clot and a 5cm bladder tumor on the anterior wall.  the ureteral orifices were in the normal orthotopic locations.  a 6 french ureteral catheter was then instilled into the left ureteral orifice.  a gentle retrograde was obtained and findings noted above. We then turned our attention to the right side. a 6 french ureteral catheter was then instilled into the right ureteral orifice.  a gentle retrograde was obtained and findings noted above. We then removed the cystoscope and placed a resectoscope into the bladder. We proceeded to remove the large clot burden from the bladder. Once this was complete we turned our attention to the bladder tumor. Using the bipolar resectoscope we removed  the bladder tumor down to the base. A subsequent muscle deep biopsy was then taken. Hemostasis was then obtained with electrocautery. We then removed the bladder tumor chips and sent them for pathology. We then re-inspected the bladder and found no residula bleeding.  the bladder was then drained, a 22 French foley was placed and this concluded the procedure which was well tolerated by patient.  Complications: None  Condition: Stable, extubated, transferred to PACU  Plan: Patient is to be discharged home and followup in 5 days for foley catheter removal and pathology discussion.

## 2018-04-29 ENCOUNTER — Encounter (HOSPITAL_COMMUNITY): Payer: Self-pay | Admitting: Urology

## 2018-04-29 NOTE — Progress Notes (Signed)
Late Entry:  50 mcg of Fentanyl wasted in sharps with Mia Creek, RN as witness @1100  on 04/28/18

## 2018-04-29 NOTE — Anesthesia Postprocedure Evaluation (Signed)
Anesthesia Post Note  Patient: Daniel Farley  Procedure(s) Performed: TRANSURETHRAL RESECTION OF BLADDER TUMOR WITH MITOMYCIN-C (N/A ) CYSTOSCOPY WITH RETROGRADE PYELOGRAM (Bilateral )     Patient location during evaluation: PACU Anesthesia Type: General Level of consciousness: awake and alert Pain management: pain level controlled Vital Signs Assessment: post-procedure vital signs reviewed and stable Respiratory status: spontaneous breathing, nonlabored ventilation, respiratory function stable and patient connected to nasal cannula oxygen Cardiovascular status: blood pressure returned to baseline and stable Postop Assessment: no apparent nausea or vomiting Anesthetic complications: no    Last Vitals:  Vitals:   04/28/18 1330 04/28/18 1345  BP: 115/74 128/74  Pulse: (!) 57 64  Resp: 19 14  Temp:  36.7 C  SpO2: 96% 97%    Last Pain:  Vitals:   04/29/18 1248  TempSrc:   PainSc: 10-Worst pain ever                 Demichael Traum S

## 2018-05-05 DIAGNOSIS — C673 Malignant neoplasm of anterior wall of bladder: Secondary | ICD-10-CM | POA: Diagnosis not present

## 2018-05-12 DIAGNOSIS — C674 Malignant neoplasm of posterior wall of bladder: Secondary | ICD-10-CM | POA: Diagnosis not present

## 2018-05-17 DIAGNOSIS — R509 Fever, unspecified: Secondary | ICD-10-CM | POA: Diagnosis not present

## 2018-05-18 ENCOUNTER — Other Ambulatory Visit: Payer: Self-pay | Admitting: Urology

## 2018-05-19 DIAGNOSIS — N390 Urinary tract infection, site not specified: Secondary | ICD-10-CM | POA: Diagnosis not present

## 2018-05-23 DIAGNOSIS — E119 Type 2 diabetes mellitus without complications: Secondary | ICD-10-CM | POA: Diagnosis not present

## 2018-05-23 NOTE — Progress Notes (Signed)
Left voicemail with Selita to request a copy of the fax she received from Mr. Gaspard PCP in reference to proceeding with surgery after fever and negative COVID screening.

## 2018-05-24 ENCOUNTER — Other Ambulatory Visit (HOSPITAL_COMMUNITY): Payer: Medicare Other

## 2018-05-25 ENCOUNTER — Encounter (HOSPITAL_COMMUNITY): Payer: Self-pay | Admitting: *Deleted

## 2018-05-25 ENCOUNTER — Other Ambulatory Visit: Payer: Self-pay

## 2018-05-25 NOTE — Progress Notes (Signed)
SPOKE W/  _ patient's spouse via phone after he gave me permission to talk to her     [place] 19:   COUGH--No  RUNNY NOSE---No   SORE THROAT---No  NASAL CONGESTION----No  SNEEZING----No  SHORTNESS OF BREATH---No  DIFFICULTY BREATHING---No  TEMP >100.4-----No  UNEXPLAINED BODY ACHES------No   HAVE YOU OR ANY FAMILY MEMBER TRAVELLED PAST 14 DAYS OUT OF THE   COUNTY---No STATE----No COUNTRY----No  HAVE YOU OR ANY FAMILY MEMBER BEEN EXPOSED TO ANYONE WITH COVID 19? No   Reviewed visitor restrictions with wife at present with the Covid 19 virus  and she verbalized understanding.

## 2018-05-30 ENCOUNTER — Encounter (HOSPITAL_COMMUNITY): Payer: Self-pay | Admitting: *Deleted

## 2018-05-30 ENCOUNTER — Encounter (HOSPITAL_COMMUNITY)
Admission: RE | Disposition: A | Discharge status: Home / Self Care | Payer: Self-pay | Source: Home / Self Care | Attending: Urology

## 2018-05-30 ENCOUNTER — Ambulatory Visit (HOSPITAL_COMMUNITY): Payer: Medicare Other | Admitting: Physician Assistant

## 2018-05-30 ENCOUNTER — Ambulatory Visit (HOSPITAL_COMMUNITY)
Admission: RE | Admit: 2018-05-30 | Discharge: 2018-05-30 | Disposition: A | Discharge status: Home / Self Care | Payer: Medicare Other | Attending: Urology | Admitting: Urology

## 2018-05-30 DIAGNOSIS — Z881 Allergy status to other antibiotic agents status: Secondary | ICD-10-CM | POA: Insufficient documentation

## 2018-05-30 DIAGNOSIS — Z87891 Personal history of nicotine dependence: Secondary | ICD-10-CM | POA: Insufficient documentation

## 2018-05-30 DIAGNOSIS — Z880 Allergy status to penicillin: Secondary | ICD-10-CM | POA: Diagnosis not present

## 2018-05-30 DIAGNOSIS — R7303 Prediabetes: Secondary | ICD-10-CM | POA: Insufficient documentation

## 2018-05-30 DIAGNOSIS — K219 Gastro-esophageal reflux disease without esophagitis: Secondary | ICD-10-CM | POA: Diagnosis not present

## 2018-05-30 DIAGNOSIS — Z79899 Other long term (current) drug therapy: Secondary | ICD-10-CM | POA: Diagnosis not present

## 2018-05-30 DIAGNOSIS — I10 Essential (primary) hypertension: Secondary | ICD-10-CM | POA: Insufficient documentation

## 2018-05-30 DIAGNOSIS — J45909 Unspecified asthma, uncomplicated: Secondary | ICD-10-CM | POA: Insufficient documentation

## 2018-05-30 DIAGNOSIS — C679 Malignant neoplasm of bladder, unspecified: Secondary | ICD-10-CM | POA: Diagnosis not present

## 2018-05-30 DIAGNOSIS — C672 Malignant neoplasm of lateral wall of bladder: Secondary | ICD-10-CM | POA: Diagnosis not present

## 2018-05-30 DIAGNOSIS — C674 Malignant neoplasm of posterior wall of bladder: Secondary | ICD-10-CM | POA: Diagnosis not present

## 2018-05-30 HISTORY — DX: Family history of other specified conditions: Z84.89

## 2018-05-30 HISTORY — PX: TRANSURETHRAL RESECTION OF BLADDER TUMOR: SHX2575

## 2018-05-30 LAB — GLUCOSE, CAPILLARY: Glucose-Capillary: 142 mg/dL — ABNORMAL HIGH (ref 70–99)

## 2018-05-30 SURGERY — TURBT (TRANSURETHRAL RESECTION OF BLADDER TUMOR)
Anesthesia: General

## 2018-05-30 MED ORDER — OXYBUTYNIN CHLORIDE ER 10 MG PO TB24: 10.0000 mg | ORAL_TABLET | Freq: Every day | ORAL | 2 refills | Status: AC

## 2018-05-30 MED ORDER — PROPOFOL 10 MG/ML IV BOLUS
INTRAVENOUS | Status: AC
  Filled 2018-05-30: qty 20

## 2018-05-30 MED ORDER — OXYCODONE HCL 5 MG/5ML PO SOLN: 5.0000 mg | Freq: Once | ORAL | Status: DC | PRN

## 2018-05-30 MED ORDER — FENTANYL CITRATE (PF) 100 MCG/2ML IJ SOLN
INTRAMUSCULAR | Status: DC | PRN
  Administered 2018-05-30: 100 ug via INTRAVENOUS

## 2018-05-30 MED ORDER — LIDOCAINE 2% (20 MG/ML) 5 ML SYRINGE
INTRAMUSCULAR | Status: DC | PRN
  Administered 2018-05-30: 100 mg via INTRAVENOUS

## 2018-05-30 MED ORDER — ROCURONIUM BROMIDE 10 MG/ML (PF) SYRINGE
PREFILLED_SYRINGE | INTRAVENOUS | Status: DC | PRN
  Administered 2018-05-30: 30 mg via INTRAVENOUS

## 2018-05-30 MED ORDER — LACTATED RINGERS IV SOLN
INTRAVENOUS | Status: DC
  Administered 2018-05-30: 10:00:00 via INTRAVENOUS

## 2018-05-30 MED ORDER — ACETAMINOPHEN 160 MG/5ML PO SOLN: 1000.0000 mg | Freq: Once | ORAL | Status: DC | PRN

## 2018-05-30 MED ORDER — FENTANYL CITRATE (PF) 100 MCG/2ML IJ SOLN
INTRAMUSCULAR | Status: AC
  Filled 2018-05-30: qty 2

## 2018-05-30 MED ORDER — ACETAMINOPHEN 500 MG PO TABS: 1000.0000 mg | ORAL_TABLET | Freq: Once | ORAL | Status: DC | PRN

## 2018-05-30 MED ORDER — PROPOFOL 10 MG/ML IV BOLUS
INTRAVENOUS | Status: DC | PRN
  Administered 2018-05-30: 130 mg via INTRAVENOUS

## 2018-05-30 MED ORDER — MIDAZOLAM HCL 2 MG/2ML IJ SOLN
INTRAMUSCULAR | Status: AC
  Filled 2018-05-30: qty 2

## 2018-05-30 MED ORDER — DEXAMETHASONE SODIUM PHOSPHATE 10 MG/ML IJ SOLN
INTRAMUSCULAR | Status: DC | PRN
  Administered 2018-05-30: 10 mg via INTRAVENOUS

## 2018-05-30 MED ORDER — ACETAMINOPHEN 10 MG/ML IV SOLN: 1000.0000 mg | Freq: Once | INTRAVENOUS | Status: DC | PRN

## 2018-05-30 MED ORDER — OXYCODONE HCL 5 MG PO TABS: 5.0000 mg | ORAL_TABLET | Freq: Once | ORAL | Status: DC | PRN

## 2018-05-30 MED ORDER — DEXAMETHASONE SODIUM PHOSPHATE 10 MG/ML IJ SOLN
INTRAMUSCULAR | Status: AC
  Filled 2018-05-30: qty 1

## 2018-05-30 MED ORDER — HYDROCODONE-ACETAMINOPHEN 5-325 MG PO TABS: 1.0000 | ORAL_TABLET | ORAL | 0 refills | Status: AC | PRN

## 2018-05-30 MED ORDER — SODIUM CHLORIDE 0.9 % IR SOLN
Status: DC | PRN
  Administered 2018-05-30: 3000 mL via INTRAVESICAL

## 2018-05-30 MED ORDER — ONDANSETRON HCL 4 MG/2ML IJ SOLN
INTRAMUSCULAR | Status: DC | PRN
  Administered 2018-05-30: 4 mg via INTRAVENOUS

## 2018-05-30 MED ORDER — ONDANSETRON HCL 4 MG/2ML IJ SOLN
INTRAMUSCULAR | Status: AC
  Filled 2018-05-30: qty 2

## 2018-05-30 MED ORDER — SUGAMMADEX SODIUM 200 MG/2ML IV SOLN
INTRAVENOUS | Status: AC
  Filled 2018-05-30: qty 2

## 2018-05-30 MED ORDER — PHENAZOPYRIDINE HCL 100 MG PO TABS: 100.0000 mg | ORAL_TABLET | Freq: Three times a day (TID) | ORAL | 0 refills | Status: AC | PRN

## 2018-05-30 MED ORDER — FENTANYL CITRATE (PF) 100 MCG/2ML IJ SOLN: 25.0000 ug | INTRAMUSCULAR | Status: DC | PRN

## 2018-05-30 MED ORDER — MIDAZOLAM HCL 2 MG/2ML IJ SOLN
INTRAMUSCULAR | Status: DC | PRN
  Administered 2018-05-30: 2 mg via INTRAVENOUS

## 2018-05-30 MED ORDER — ROCURONIUM BROMIDE 10 MG/ML (PF) SYRINGE
PREFILLED_SYRINGE | INTRAVENOUS | Status: AC
  Filled 2018-05-30: qty 10

## 2018-05-30 MED ORDER — SUCCINYLCHOLINE CHLORIDE 200 MG/10ML IV SOSY
PREFILLED_SYRINGE | INTRAVENOUS | Status: AC
  Filled 2018-05-30: qty 10

## 2018-05-30 MED ORDER — CEFAZOLIN SODIUM-DEXTROSE 2-4 GM/100ML-% IV SOLN
2.0000 g | INTRAVENOUS | Status: AC
  Administered 2018-05-30: 12:00:00 2 g via INTRAVENOUS
  Filled 2018-05-30: qty 100

## 2018-05-30 MED ORDER — LIDOCAINE 2% (20 MG/ML) 5 ML SYRINGE
INTRAMUSCULAR | Status: AC
  Filled 2018-05-30: qty 5

## 2018-05-30 MED ORDER — SUGAMMADEX SODIUM 200 MG/2ML IV SOLN
INTRAVENOUS | Status: DC | PRN
  Administered 2018-05-30: 200 mg via INTRAVENOUS

## 2018-05-30 MED ORDER — STERILE WATER FOR IRRIGATION IR SOLN
Status: DC | PRN
  Administered 2018-05-30: 1000 mL

## 2018-05-30 MED ORDER — SUCCINYLCHOLINE CHLORIDE 200 MG/10ML IV SOSY
PREFILLED_SYRINGE | INTRAVENOUS | Status: DC | PRN
  Administered 2018-05-30: 100 mg via INTRAVENOUS

## 2018-05-30 SURGICAL SUPPLY — 19 items
BAG URINE DRAINAGE (UROLOGICAL SUPPLIES) IMPLANT
BAG URO CATCHER STRL LF (MISCELLANEOUS) ×3 IMPLANT
CATH FOLEY 3WAY 30CC 22FR (CATHETERS) ×3 IMPLANT
COVER WAND RF STERILE (DRAPES) IMPLANT
ELECT REM PT RETURN 15FT ADLT (MISCELLANEOUS) ×3 IMPLANT
EVACUATOR MICROVAS BLADDER (UROLOGICAL SUPPLIES) IMPLANT
GLOVE BIOGEL M 8.0 STRL (GLOVE) ×3 IMPLANT
GOWN STRL REUS W/TWL LRG LVL3 (GOWN DISPOSABLE) ×3 IMPLANT
GOWN STRL REUS W/TWL XL LVL3 (GOWN DISPOSABLE) ×3 IMPLANT
KIT TURNOVER KIT A (KITS) IMPLANT
LOOP CUT BIPOLAR 24F LRG (ELECTROSURGICAL) ×3 IMPLANT
MANIFOLD NEPTUNE II (INSTRUMENTS) ×3 IMPLANT
PACK CYSTO (CUSTOM PROCEDURE TRAY) ×3 IMPLANT
PLUG CATH AND CAP STER (CATHETERS) ×3 IMPLANT
SET ASPIRATION TUBING (TUBING) IMPLANT
SYRINGE IRR TOOMEY STRL 70CC (SYRINGE) IMPLANT
TUBING CONNECTING 10 (TUBING) ×2 IMPLANT
TUBING CONNECTING 10' (TUBING) ×1
TUBING UROLOGY SET (TUBING) ×3 IMPLANT

## 2018-05-30 NOTE — Discharge Instructions (Signed)
Bladder Cancer  Bladder cancer is an abnormal growth of tissue in the bladder. The bladder is the balloon-like sac in the pelvis. It collects and stores urine that comes from the kidneys through the ureters. The bladder wall is made of layers. If cancer spreads into these layers and through the wall of the bladder, it becomes more difficult to treat. What are the causes? The cause of this condition is not known. What increases the risk? The following factors may make you more likely to develop this condition:  Smoking.  Workplace risks (occupational exposures), such as rubber, leather, textile, dyes, chemicals, and paint.  Being white.  Your age. Most people with bladder cancer are over the age of 55.  Being male.  Having chronic bladder inflammation.  Having a personal history of bladder cancer.  Having a family history of bladder cancer (heredity).  Having had chemotherapy or radiation therapy to the pelvis.  Having been exposed to arsenic. What are the signs or symptoms? Initial symptoms of this condition include:  Blood in the urine.  Painful urination.  Frequent bladder or urine infections.  Increase in urgency and frequency of urination. Advanced symptoms of this condition include:  Not being able to urinate.  Low back pain on one side.  Loss of appetite.  Weight loss.  Fatigue.  Swelling in the feet.  Bone pain. How is this diagnosed? This condition is diagnosed based on your medical history, a physical exam, urine tests, lab tests, imaging tests, and your symptoms. You may also have other tests or procedures done, such as:  A narrow tube being inserted into your bladder through your urethra (cystoscopy) in order to view the lining of your bladder for tumors.  A biopsy to sample the tumor to see if cancer is present. If cancer is present, it will then be staged to determine its severity and extent. Staging is an assessment of:  The size of the  tumor.  Whether the cancer has spread.  Where the cancer has spread. It is important to know how deeply into the bladder wall cancer has grown and whether cancer has spread to any other parts of your body. Staging may require blood tests or imaging tests, such as a CT scan, MRI, bone scan, or chest X-ray. How is this treated? Based on the stage of cancer, one treatment or a combination of treatments may be recommended. The most common forms of treatment are:  Surgery to remove the cancer. Procedures that may be done include transurethral resection and cystectomy.  Radiation therapy. This is high-energy X-rays or other particles. This is often used in combination with chemotherapy.  Chemotherapy. During this treatment, medicines are used to kill cancer cells.  Immunotherapy. This uses medicines to help your own immune system destroy cancer cells. Follow these instructions at home:  Take over-the-counter and prescription medicines only as told by your health care provider.  Maintain a healthy diet. Some of your treatments might affect your appetite.  Consider joining a support group. This may help you learn to cope with the stress of having bladder cancer.  Tell your cancer care team if you develop side effects. They may be able to recommend ways to relieve them.  Keep all follow-up visits as told by your health care provider. This is important. Where to find more information  American Cancer Society: www.cancer.org  National Cancer Institute (NCI): www.cancer.gov Contact a health care provider if:  You have symptoms of a urinary tract infection. These include: ?   Fever. ? Chills. ? Weakness. ? Muscle aches. ? Abdominal pain. ? Frequent and intense urge to urinate. ? Burning feeling in the bladder or urethra during urination. Get help right away if:  There is blood in your urine.  You cannot urinate.  You have severe pain or other symptoms that do not go  away. Summary  Bladder cancer is an abnormal growth of tissue in the bladder.  This condition is diagnosed based on your medical history, a physical exam, urine tests, lab tests, imaging tests, and your symptoms.  Based on the stage of cancer, surgery, chemotherapy, or a combination of treatments may be recommended.  Consider joining a support group. This may help you learn to cope with the stress of having bladder cancer. This information is not intended to replace advice given to you by your health care provider. Make sure you discuss any questions you have with your health care provider. Document Released: 01/22/2003 Document Revised: 12/24/2015 Document Reviewed: 12/24/2015 Elsevier Interactive Patient Education  2019 [employer]vier Inc.  

## 2018-05-30 NOTE — Anesthesia Procedure Notes (Signed)
Procedure Name: Intubation Date/Time: 05/30/2018 11:58 AM Performed by: Sharlette Dense, CRNA Patient Re-evaluated:Patient Re-evaluated prior to induction Oxygen Delivery Method: Circle system utilized Preoxygenation: Pre-oxygenation with 100% oxygen Induction Type: IV induction and Rapid sequence Laryngoscope Size: Miller and 3 Grade View: Grade II Tube type: Oral Tube size: 7.5 mm Number of attempts: 1 Airway Equipment and Method: Stylet Placement Confirmation: ETT inserted through vocal cords under direct vision,  positive ETCO2 and breath sounds checked- equal and bilateral Secured at: 21 cm Tube secured with: Tape Dental Injury: Teeth and Oropharynx as per pre-operative assessment

## 2018-05-30 NOTE — Op Note (Signed)
.  Preoperative diagnosis: bladder cancer  Postoperative diagnosis: Same  Procedure: 1 cystoscopy 2. Transurethral resection of bladder tumor small  Attending: Rosie Fate  Anesthesia: General  Estimated blood loss: Minimal  Drains: 22 French foley  Specimens: right lateral wall bladder tumor  Antibiotics: cef  Findings: 1cm papillary right lateral wall tumor.  Ureteral orifices in normal anatomic location.   Indications: Patient is a 67 year old male with a history of bladder cancer.  After discussing treatment options, they decided proceed with transurethral resection of a bladder tumor.  Procedure her in detail: The patient was brought to the operating room and a brief timeout was done to ensure correct patient, correct procedure, correct site.  General anesthesia was administered patient was placed in dorsal lithotomy position.  Their genitalia was then prepped and draped in usual sterile fashion.  A rigid 98 French cystoscope was passed in the urethra and the bladder.  Bladder was inspected and we noted a large amount of clot and a 1cm right lateral wall bladder tumor.  the ureteral orifices were in the normal orthotopic locations.    Using the bipolar resectoscope we removed the bladder tumor down to the base. Hemostasis was then obtained with electrocautery. We then removed the bladder tumor chips and sent them for pathology. We then re-inspected the bladder and found no residula bleeding.  the bladder was then drained, a 22 French foley was placed and this concluded the procedure which was well tolerated by patient.  Complications: None  Condition: Stable, extubated, transferred to PACU  Plan: Patient is to be discharged home and followup in 5 days for foley catheter removal and pathology discussion.

## 2018-05-30 NOTE — Transfer of Care (Signed)
Immediate Anesthesia Transfer of Care Note  Patient: Daniel Farley  Procedure(s) Performed: TRANSURETHRAL RESECTION OF BLADDER TUMOR (TURBT) (N/A )  Patient Location: PACU  Anesthesia Type:General  Level of Consciousness: awake, alert  and oriented  Airway & Oxygen Therapy: Patient Spontanous Breathing and Patient connected to face mask oxygen  Post-op Assessment: Report given to RN and Post -op Vital signs reviewed and stable  Post vital signs: Reviewed and stable  Last Vitals:  Vitals Value Taken Time  BP 148/92 05/30/2018 12:40 PM  Temp    Pulse 77 05/30/2018 12:41 PM  Resp 17 05/30/2018 12:41 PM  SpO2 96 % 05/30/2018 12:41 PM  Vitals shown include unvalidated device data.  Last Pain:  Vitals:   05/30/18 0958  TempSrc: Oral         Complications: No apparent anesthesia complications

## 2018-05-30 NOTE — H&P (Signed)
Urology Admission H&P  Chief Complaint: bladder cancer  History of Present Illness: Daniel Farley is a 68yo with a history of bladder cancer here for repeat resection. Pathology from his first resection was T1G3 bladder cancer. No recent hematuria. No worsening LUTS. No fevers/chills/sweats. No nausea/vomiting  Past Medical History:  Diagnosis Date  . Asthma    bronchial-well controlled  . Cancer [facility])    Bladder  . Family history of adverse reaction to anesthesia    mother has a hard time waking up from Anesthesia  . GERD (gastroesophageal reflux disease)    mild, on no preventative meds  . Hypertension   . Pre-diabetes    will be started on meds likely at next md vist, A1C 8.8   Past Surgical History:  Procedure Laterality Date  . BUNIONECTOMY Right 2014  . COLONOSCOPY  2009,2014,2019  . CYSTOSCOPY W/ RETROGRADES Bilateral 04/28/2018   Procedure: CYSTOSCOPY WITH RETROGRADE PYELOGRAM;  Surgeon: Cleon Gustin, MD;  Location: [facility];  Service: Urology;  Laterality: Bilateral;  . NASAL SINUS SURGERY  1990s  . TRANSURETHRAL RESECTION OF BLADDER TUMOR WITH MITOMYCIN-C N/A 04/28/2018   Procedure: TRANSURETHRAL RESECTION OF BLADDER TUMOR WITH MITOMYCIN-C;  Surgeon: Cleon Gustin, MD;  Location: [facility];  Service: Urology;  Laterality: N/A;    Home Medications:  Current Facility-Administered Medications  Medication Dose Route Frequency Provider Last Rate Last Dose  . ceFAZolin (ANCEF) IVPB 2g/100 mL premix  2 g Intravenous 30 min Pre-Op Alyson Ingles Candee Furbish, MD      . lactated ringers infusion   Intravenous Continuous Oleta Mouse, MD 50 mL/hr at 05/30/18 1012     Allergies:  Allergies  Allergen Reactions  . Augmentin [Amoxicillin-Pot Clavulanate] Diarrhea and Nausea And Vomiting    Did it involve swelling of the face/tongue/throat, SOB, or low BP? No Did it involve sudden or severe rash/hives, skin peeling, or any reaction on the inside of your mouth or nose? No Did  you need to seek medical attention at a hospital or doctor's office? No When did it last happen?3-4 years ago If all above answers are "NO", may proceed with cephalosporin use.     History reviewed. No pertinent family history. Social History:  reports that he quit smoking about 35 years ago. His smoking use included cigarettes. He has a 3.50 pack-year smoking history. He quit smokeless tobacco use about 50 years ago.  His smokeless tobacco use included chew. He reports that he does not use drugs. No history on file for alcohol.  Review of Systems  All other systems reviewed and are negative.   Physical Exam:  Vital signs in last 24 hours: Temp:  [97.8 F (36.6 C)] 97.8 F (36.6 C) (04/27 0958) Pulse Rate:  [87] 87 (04/27 0958) Resp:  [16] 16 (04/27 0958) BP: (138)/(100) 138/100 (04/27 0958) SpO2:  [98 %] 98 % (04/27 0958) Weight:  [82.1 kg] 82.1 kg (04/27 0958) Physical Exam  Constitutional: He is oriented to person, place, and time. He appears well-developed and well-nourished.  HENT:  Head: Normocephalic and atraumatic.  Eyes: Pupils are equal, round, and reactive to light. EOM are normal.  Neck: Normal range of motion. No thyromegaly present.  Cardiovascular: Normal rate and regular rhythm.  Respiratory: Effort normal. No respiratory distress.  GI: Soft. He exhibits no distension.  Musculoskeletal: Normal range of motion.        General: No edema.  Neurological: He is alert and oriented to person, place, and time.  Skin: Skin is  warm and dry.  Psychiatric: He has a normal mood and affect. His behavior is normal. Judgment and thought content normal.    Laboratory Data:  No results found for this or any previous visit (from the past 24 hour(s)). No results found for this or any previous visit (from the past 240 hour(s)). Creatinine: No results for input(s): CREATININE in the last 168 hours. Baseline Creatinine: unknown  Impression/Assessment:  67yo with bladder  cancer  Plan:  The risks/benefits/alternatives to repeat bladder tumor resection was explained to the patient and he understands and wishes to proceed with surgery  Nicolette Bang 05/30/2018, 11:30 AM

## 2018-05-30 NOTE — Anesthesia Preprocedure Evaluation (Addendum)
Anesthesia Evaluation  Patient identified by MRN, date of birth, ID band Patient awake    Reviewed: Allergy & Precautions, H&P , NPO status , Patient's Chart, lab work & pertinent test results  Airway Mallampati: II   Neck ROM: full    Dental  (+) Dental Advisory Given, Teeth Intact   Pulmonary asthma , former smoker,    breath sounds clear to auscultation       Cardiovascular hypertension, Pt. on medications (-) angina(-) Past MI  Rhythm:regular Rate:Normal     Neuro/Psych    GI/Hepatic Neg liver ROS, GERD  ,  Endo/Other  negative endocrine ROS  Renal/GU Renal disease   Bladder CA    Musculoskeletal negative musculoskeletal ROS (+)   Abdominal   Peds  Hematology negative hematology ROS (+)   Anesthesia Other Findings   Reproductive/Obstetrics                            Anesthesia Physical Anesthesia Plan  ASA: II  Anesthesia Plan: General   Post-op Pain Management:    Induction: Intravenous and Rapid sequence  PONV Risk Score and Plan: 2 and Dexamethasone and Ondansetron  Airway Management Planned: Oral ETT  Additional Equipment: None  Intra-op Plan:   Post-operative Plan: Extubation in OR  Informed Consent: I have reviewed the patients History and Physical, chart, labs and discussed the procedure including the risks, benefits and alternatives for the proposed anesthesia with the patient or authorized representative who has indicated his/her understanding and acceptance.     Dental advisory given  Plan Discussed with: CRNA and Surgeon  Anesthesia Plan Comments:         Anesthesia Quick Evaluation

## 2018-05-31 ENCOUNTER — Encounter (HOSPITAL_COMMUNITY): Payer: Self-pay | Admitting: Urology

## 2018-06-01 DIAGNOSIS — C672 Malignant neoplasm of lateral wall of bladder: Secondary | ICD-10-CM | POA: Diagnosis not present

## 2018-06-07 NOTE — Anesthesia Postprocedure Evaluation (Signed)
Anesthesia Post Note  Patient: Daniel Farley  Procedure(s) Performed: TRANSURETHRAL RESECTION OF BLADDER TUMOR (TURBT) (N/A )     Patient location during evaluation: PACU Anesthesia Type: General Level of consciousness: awake and alert Pain management: pain level controlled Vital Signs Assessment: post-procedure vital signs reviewed and stable Respiratory status: spontaneous breathing, nonlabored ventilation, respiratory function stable and patient connected to nasal cannula oxygen Cardiovascular status: blood pressure returned to baseline and stable Postop Assessment: no apparent nausea or vomiting Anesthetic complications: no    Last Vitals:  Vitals:   05/30/18 1330 05/30/18 1345  BP: (!) 142/81 (!) 141/83  Pulse: 67   Resp: 19   Temp: 36.6 C   SpO2: 97%     Last Pain:  Vitals:   05/30/18 0958  TempSrc: Oral                 Kainoa Swoboda

## 2018-06-30 DIAGNOSIS — L720 Epidermal cyst: Secondary | ICD-10-CM | POA: Diagnosis not present

## 2018-06-30 DIAGNOSIS — L821 Other seborrheic keratosis: Secondary | ICD-10-CM | POA: Diagnosis not present

## 2018-06-30 DIAGNOSIS — L820 Inflamed seborrheic keratosis: Secondary | ICD-10-CM | POA: Diagnosis not present

## 2018-06-30 DIAGNOSIS — L578 Other skin changes due to chronic exposure to nonionizing radiation: Secondary | ICD-10-CM | POA: Diagnosis not present

## 2018-07-12 DIAGNOSIS — Z5111 Encounter for antineoplastic chemotherapy: Secondary | ICD-10-CM | POA: Diagnosis not present

## 2018-07-12 DIAGNOSIS — C672 Malignant neoplasm of lateral wall of bladder: Secondary | ICD-10-CM | POA: Diagnosis not present

## 2018-07-19 DIAGNOSIS — C672 Malignant neoplasm of lateral wall of bladder: Secondary | ICD-10-CM | POA: Diagnosis not present

## 2018-07-26 DIAGNOSIS — C674 Malignant neoplasm of posterior wall of bladder: Secondary | ICD-10-CM | POA: Diagnosis not present

## 2018-07-26 DIAGNOSIS — Z5111 Encounter for antineoplastic chemotherapy: Secondary | ICD-10-CM | POA: Diagnosis not present

## 2018-07-26 DIAGNOSIS — C673 Malignant neoplasm of anterior wall of bladder: Secondary | ICD-10-CM | POA: Diagnosis not present

## 2018-08-02 DIAGNOSIS — C673 Malignant neoplasm of anterior wall of bladder: Secondary | ICD-10-CM | POA: Diagnosis not present

## 2018-08-02 DIAGNOSIS — Z5111 Encounter for antineoplastic chemotherapy: Secondary | ICD-10-CM | POA: Diagnosis not present

## 2018-08-02 DIAGNOSIS — C672 Malignant neoplasm of lateral wall of bladder: Secondary | ICD-10-CM | POA: Diagnosis not present

## 2018-08-09 DIAGNOSIS — C672 Malignant neoplasm of lateral wall of bladder: Secondary | ICD-10-CM | POA: Diagnosis not present

## 2018-08-09 DIAGNOSIS — Z5111 Encounter for antineoplastic chemotherapy: Secondary | ICD-10-CM | POA: Diagnosis not present

## 2018-08-16 DIAGNOSIS — Z5111 Encounter for antineoplastic chemotherapy: Secondary | ICD-10-CM | POA: Diagnosis not present

## 2018-08-16 DIAGNOSIS — C673 Malignant neoplasm of anterior wall of bladder: Secondary | ICD-10-CM | POA: Diagnosis not present

## 2018-08-25 DIAGNOSIS — H04123 Dry eye syndrome of bilateral lacrimal glands: Secondary | ICD-10-CM | POA: Diagnosis not present

## 2018-11-10 DIAGNOSIS — Z23 Encounter for immunization: Secondary | ICD-10-CM | POA: Diagnosis not present

## 2018-11-15 DIAGNOSIS — C674 Malignant neoplasm of posterior wall of bladder: Secondary | ICD-10-CM | POA: Diagnosis not present

## 2019-01-19 DIAGNOSIS — Z23 Encounter for immunization: Secondary | ICD-10-CM | POA: Diagnosis not present

## 2019-01-19 DIAGNOSIS — Z79899 Other long term (current) drug therapy: Secondary | ICD-10-CM | POA: Diagnosis not present

## 2019-01-19 DIAGNOSIS — J45909 Unspecified asthma, uncomplicated: Secondary | ICD-10-CM | POA: Diagnosis not present

## 2019-01-19 DIAGNOSIS — Z0000 Encounter for general adult medical examination without abnormal findings: Secondary | ICD-10-CM | POA: Diagnosis not present

## 2019-01-19 DIAGNOSIS — E782 Mixed hyperlipidemia: Secondary | ICD-10-CM | POA: Diagnosis not present

## 2019-01-19 DIAGNOSIS — I10 Essential (primary) hypertension: Secondary | ICD-10-CM | POA: Diagnosis not present

## 2019-01-19 DIAGNOSIS — E7800 Pure hypercholesterolemia, unspecified: Secondary | ICD-10-CM | POA: Diagnosis not present

## 2019-01-19 DIAGNOSIS — E1169 Type 2 diabetes mellitus with other specified complication: Secondary | ICD-10-CM | POA: Diagnosis not present

## 2019-02-16 DIAGNOSIS — C674 Malignant neoplasm of posterior wall of bladder: Secondary | ICD-10-CM | POA: Diagnosis not present

## 2019-03-13 DIAGNOSIS — E119 Type 2 diabetes mellitus without complications: Secondary | ICD-10-CM | POA: Diagnosis not present

## 2019-03-13 DIAGNOSIS — H524 Presbyopia: Secondary | ICD-10-CM | POA: Diagnosis not present

## 2019-03-13 DIAGNOSIS — H2513 Age-related nuclear cataract, bilateral: Secondary | ICD-10-CM | POA: Diagnosis not present

## 2019-05-19 DIAGNOSIS — C674 Malignant neoplasm of posterior wall of bladder: Secondary | ICD-10-CM | POA: Diagnosis not present

## 2019-06-01 DIAGNOSIS — E1169 Type 2 diabetes mellitus with other specified complication: Secondary | ICD-10-CM | POA: Diagnosis not present

## 2019-06-01 DIAGNOSIS — E782 Mixed hyperlipidemia: Secondary | ICD-10-CM | POA: Diagnosis not present

## 2019-06-01 DIAGNOSIS — E7800 Pure hypercholesterolemia, unspecified: Secondary | ICD-10-CM | POA: Diagnosis not present

## 2019-06-01 DIAGNOSIS — Z79899 Other long term (current) drug therapy: Secondary | ICD-10-CM | POA: Diagnosis not present

## 2019-06-02 DIAGNOSIS — I10 Essential (primary) hypertension: Secondary | ICD-10-CM | POA: Diagnosis not present

## 2019-06-02 DIAGNOSIS — E785 Hyperlipidemia, unspecified: Secondary | ICD-10-CM | POA: Diagnosis not present

## 2019-07-25 DIAGNOSIS — L820 Inflamed seborrheic keratosis: Secondary | ICD-10-CM | POA: Diagnosis not present

## 2019-07-25 DIAGNOSIS — L578 Other skin changes due to chronic exposure to nonionizing radiation: Secondary | ICD-10-CM | POA: Diagnosis not present

## 2019-07-25 DIAGNOSIS — L821 Other seborrheic keratosis: Secondary | ICD-10-CM | POA: Diagnosis not present

## 2019-08-23 ENCOUNTER — Ambulatory Visit: Payer: Medicare Other | Admitting: Urology

## 2019-08-30 ENCOUNTER — Ambulatory Visit (INDEPENDENT_AMBULATORY_CARE_PROVIDER_SITE_OTHER): Payer: Medicare Other | Admitting: Urology

## 2019-08-30 ENCOUNTER — Other Ambulatory Visit: Payer: Self-pay

## 2019-08-30 ENCOUNTER — Encounter: Payer: Self-pay | Admitting: Urology

## 2019-08-30 VITALS — BP 131/86 | HR 63 | Temp 97.9°F | Ht 68.0 in | Wt 192.0 lb

## 2019-08-30 DIAGNOSIS — C679 Malignant neoplasm of bladder, unspecified: Secondary | ICD-10-CM | POA: Diagnosis not present

## 2019-08-30 LAB — URINALYSIS, ROUTINE W REFLEX MICROSCOPIC
Bilirubin, UA: NEGATIVE
Glucose, UA: NEGATIVE
Ketones, UA: NEGATIVE
Leukocytes,UA: NEGATIVE
Nitrite, UA: NEGATIVE
Protein,UA: NEGATIVE
RBC, UA: NEGATIVE
Specific Gravity, UA: <1.005 — ABNORMAL LOW (ref 1.005–1.030)
Urobilinogen, Ur: 0.2 mg/dL (ref 0.2–1.0)
pH, UA: 5 (ref 5.0–7.5)

## 2019-08-30 NOTE — Progress Notes (Signed)
   08/30/19  CC: hx of bladder cancer  HPI: Daniel Farley is a 69yo here for followup for bladder cancer. No worsening LUTS. No hematuria, UA normal today  His records from [facility] are as follows: I have bladder cancer.  HPI: Daniel Farley is a 69 year-old male established patient who is here for bladder cancer.  His problem was diagnosed 04/28/2018. His bladder cancer was diagnosed by Nicolette Bang. His cancer was diagnosed at [facility]. The bladder cancer was found because of blood in his urine.   His bladder cancer was treated by removal with scope. Patient denies removal of the entire bladder, radiation, and chemotherapy.   He does have a good appetite. BOWEL HABITS: his bowels are moving normally. He is not having pain in new locations. He has not recently had unwanted weight loss.   05/12/2018: Pathology revealed T1G3 TCC   06/01/2018: Returns today for catheter removal after 2nd TURBT to remove remaining portion of large bladder wall tumor on 05/30/2018. Operative note indicates a 1cm papillary tumor was resected from the right lateral wall. He has done well post-procedure. Denies significant gross hematuria or painful urgency. He remains on alfuzosin and has been taking oxybutynin and Pyridium prn. Denies f/c, n/v.   11/15/2018: NO new LUTS. no hematuria or dysuria. patient completed 6 weeks of BCG therapy   02/16/2019: No hematuria or dysuria   05/19/2019: NO worsening LUTS. no hematuria or dysuria     There were no vitals taken for this visit. NED. A&Ox3.   No respiratory distress   Abd soft, NT, ND Normal phallus with bilateral descended testicles  Cystoscopy Procedure Note  Patient identification was confirmed, informed consent was obtained, and patient was prepped using Betadine solution.  Lidocaine jelly was administered per urethral meatus.     Pre-Procedure: - Inspection reveals a normal caliber ureteral meatus.  Procedure: The flexible cystoscope was introduced without  difficulty - No urethral strictures/lesions are present. - Enlarged prostate  - Normal bladder neck - Bilateral ureteral orifices identified - Bladder mucosa  reveals no ulcers, tumors, or lesions - No bladder stones - No trabeculation  Retroflexion shows 2cm intravesical prostatic protrusion   Post-Procedure: - Patient tolerated the procedure well  Assessment/ Plan: RTC 3 months for cystoscopy  No follow-ups on file.  Nicolette Bang, MD

## 2019-08-30 NOTE — Progress Notes (Signed)
Urological Symptom Review  Patient is experiencing the following symptoms: Get up at night to urinate   Review of Systems  Gastrointestinal (upper)  : Negative for upper GI symptoms  Gastrointestinal (lower) : Negative for lower GI symptoms  Constitutional : Negative for symptoms  Skin: Negative for skin symptoms  Eyes: Sinus problems  Ear/Nose/Throat : Negative for Ear/Nose/Throat symptoms  Hematologic/Lymphatic: Easy bruising  Cardiovascular : Negative for cardiovascular symptoms  Respiratory : Negative for respiratory symptoms  Endocrine: Negative for endocrine symptoms  Musculoskeletal: Negative for musculoskeletal symptoms  Neurological: Negative for neurological symptoms  Psychologic: Negative for psychiatric symptoms

## 2019-08-30 NOTE — Patient Instructions (Signed)
Bladder Cancer  Bladder cancer is an abnormal growth of tissue in the bladder. The bladder is the balloon-like sac in the pelvis. It collects and stores urine that comes from the kidneys through the ureters. The bladder wall is made of layers. If cancer spreads into these layers and through the wall of the bladder, it becomes more difficult to treat. What are the causes? The cause of this condition is not known. What increases the risk? The following factors may make you more likely to develop this condition:  Smoking.  Workplace risks (occupational exposures), such as rubber, leather, textile, dyes, chemicals, and paint.  Being white.  Your age. Most people with bladder cancer are over the age of 55.  Being male.  Having chronic bladder inflammation.  Having a personal history of bladder cancer.  Having a family history of bladder cancer (heredity).  Having had chemotherapy or radiation therapy to the pelvis.  Having been exposed to arsenic. What are the signs or symptoms? Initial symptoms of this condition include:  Blood in the urine.  Painful urination.  Frequent bladder or urine infections.  Increase in urgency and frequency of urination. Advanced symptoms of this condition include:  Not being able to urinate.  Low back pain on one side.  Loss of appetite.  Weight loss.  Fatigue.  Swelling in the feet.  Bone pain. How is this diagnosed? This condition is diagnosed based on your medical history, a physical exam, urine tests, lab tests, imaging tests, and your symptoms. You may also have other tests or procedures done, such as:  A narrow tube being inserted into your bladder through your urethra (cystoscopy) in order to view the lining of your bladder for tumors.  A biopsy to sample the tumor to see if cancer is present. If cancer is present, it will then be staged to determine its severity and extent. Staging is an assessment of:  The size of the  tumor.  Whether the cancer has spread.  Where the cancer has spread. It is important to know how deeply into the bladder wall cancer has grown and whether cancer has spread to any other parts of your body. Staging may require blood tests or imaging tests, such as a CT scan, MRI, bone scan, or chest X-ray. How is this treated? Based on the stage of cancer, one treatment or a combination of treatments may be recommended. The most common forms of treatment are:  Surgery to remove the cancer. Procedures that may be done include transurethral resection and cystectomy.  Radiation therapy. This is high-energy X-rays or other particles. This is often used in combination with chemotherapy.  Chemotherapy. During this treatment, medicines are used to kill cancer cells.  Immunotherapy. This uses medicines to help your own immune system destroy cancer cells. Follow these instructions at home:  Take over-the-counter and prescription medicines only as told by your health care provider.  Maintain a healthy diet. Some of your treatments might affect your appetite.  Consider joining a support group. This may help you learn to cope with the stress of having bladder cancer.  Tell your cancer care team if you develop side effects. They may be able to recommend ways to relieve them.  Keep all follow-up visits as told by your health care provider. This is important. Where to find more information  American Cancer Society: www.cancer.org  National Cancer Institute (NCI): www.cancer.gov Contact a health care provider if:  You have symptoms of a urinary tract infection. These include: ?   Fever. ? Chills. ? Weakness. ? Muscle aches. ? Abdominal pain. ? Frequent and intense urge to urinate. ? Burning feeling in the bladder or urethra during urination. Get help right away if:  There is blood in your urine.  You cannot urinate.  You have severe pain or other symptoms that do not go  away. Summary  Bladder cancer is an abnormal growth of tissue in the bladder.  This condition is diagnosed based on your medical history, a physical exam, urine tests, lab tests, imaging tests, and your symptoms.  Based on the stage of cancer, surgery, chemotherapy, or a combination of treatments may be recommended.  Consider joining a support group. This may help you learn to cope with the stress of having bladder cancer. This information is not intended to replace advice given to you by your health care provider. Make sure you discuss any questions you have with your health care provider. Document Revised: 01/01/2017 Document Reviewed: 12/24/2015 Elsevier Patient Education  2020 Elsevier Inc.  

## 2019-11-30 ENCOUNTER — Other Ambulatory Visit: Payer: Self-pay

## 2019-11-30 ENCOUNTER — Encounter: Payer: Self-pay | Admitting: Urology

## 2019-11-30 ENCOUNTER — Ambulatory Visit (INDEPENDENT_AMBULATORY_CARE_PROVIDER_SITE_OTHER): Payer: Medicare Other | Admitting: Urology

## 2019-11-30 VITALS — BP 136/81 | HR 59 | Temp 98.2°F | Ht 68.0 in | Wt 192.0 lb

## 2019-11-30 DIAGNOSIS — C679 Malignant neoplasm of bladder, unspecified: Secondary | ICD-10-CM

## 2019-11-30 LAB — URINALYSIS, ROUTINE W REFLEX MICROSCOPIC
Bilirubin, UA: NEGATIVE
Glucose, UA: NEGATIVE
Ketones, UA: NEGATIVE
Leukocytes,UA: NEGATIVE
Nitrite, UA: NEGATIVE
Protein,UA: NEGATIVE
RBC, UA: NEGATIVE
Specific Gravity, UA: 1.02 (ref 1.005–1.030)
Urobilinogen, Ur: 0.2 mg/dL (ref 0.2–1.0)
pH, UA: 5.5 (ref 5.0–7.5)

## 2019-11-30 MED ORDER — CIPROFLOXACIN HCL 500 MG PO TABS
500.0000 mg | ORAL_TABLET | Freq: Once | ORAL | Status: AC
  Administered 2019-11-30: 500 mg via ORAL

## 2019-11-30 NOTE — Progress Notes (Signed)
   11/30/19  CC: Followup bladder cancer  HPI: Mr Daniel Farley is a 69yo here for followup for T1G3 bladder cancer diagnosed in 05/2018. He had BCG 07/2018. No hematuria or dysuria.   Blood pressure 136/81, pulse (!) 59, temperature 98.2 F (36.8 C), height 5\' 8"  (1.727 m), weight 192 lb (87.1 kg). NED. A&Ox3.   No respiratory distress   Abd soft, NT, ND Normal phallus with bilateral descended testicles  Cystoscopy Procedure Note  Patient identification was confirmed, informed consent was obtained, and patient was prepped using Betadine solution.  Lidocaine jelly was administered per urethral meatus.     Pre-Procedure: - Inspection reveals a normal caliber ureteral meatus.  Procedure: The flexible cystoscope was introduced without difficulty - No urethral strictures/lesions are present. - Enlarged prostate  - Normal bladder neck - Bilateral ureteral orifices identified - 21mm erythematous lesions posterior wall of bladder - No bladder stones - No trabeculation    Post-Procedure: - Patient tolerated the procedure well  Assessment/ Plan: We discussed the management of the lesion including surveillance versus bladder biopsy and we have elected to pursue surveillance. He will followup in 3 months for cystoscoopy  No follow-ups on file.  Nicolette Bang, MD

## 2019-11-30 NOTE — Patient Instructions (Signed)
Bladder Cancer  Bladder cancer is an abnormal growth of tissue in the bladder. The bladder is the balloon-like sac in the pelvis. It collects and stores urine that comes from the kidneys through the ureters. The bladder wall is made of layers. If cancer spreads into these layers and through the wall of the bladder, it becomes more difficult to treat. What are the causes? The cause of this condition is not known. What increases the risk? The following factors may make you more likely to develop this condition:  Smoking.  Workplace risks (occupational exposures), such as rubber, leather, textile, dyes, chemicals, and paint.  Being white.  Your age. Most people with bladder cancer are over the age of 55.  Being male.  Having chronic bladder inflammation.  Having a personal history of bladder cancer.  Having a family history of bladder cancer (heredity).  Having had chemotherapy or radiation therapy to the pelvis.  Having been exposed to arsenic. What are the signs or symptoms? Initial symptoms of this condition include:  Blood in the urine.  Painful urination.  Frequent bladder or urine infections.  Increase in urgency and frequency of urination. Advanced symptoms of this condition include:  Not being able to urinate.  Low back pain on one side.  Loss of appetite.  Weight loss.  Fatigue.  Swelling in the feet.  Bone pain. How is this diagnosed? This condition is diagnosed based on your medical history, a physical exam, urine tests, lab tests, imaging tests, and your symptoms. You may also have other tests or procedures done, such as:  A narrow tube being inserted into your bladder through your urethra (cystoscopy) in order to view the lining of your bladder for tumors.  A biopsy to sample the tumor to see if cancer is present. If cancer is present, it will then be staged to determine its severity and extent. Staging is an assessment of:  The size of the  tumor.  Whether the cancer has spread.  Where the cancer has spread. It is important to know how deeply into the bladder wall cancer has grown and whether cancer has spread to any other parts of your body. Staging may require blood tests or imaging tests, such as a CT scan, MRI, bone scan, or chest X-ray. How is this treated? Based on the stage of cancer, one treatment or a combination of treatments may be recommended. The most common forms of treatment are:  Surgery to remove the cancer. Procedures that may be done include transurethral resection and cystectomy.  Radiation therapy. This is high-energy X-rays or other particles. This is often used in combination with chemotherapy.  Chemotherapy. During this treatment, medicines are used to kill cancer cells.  Immunotherapy. This uses medicines to help your own immune system destroy cancer cells. Follow these instructions at home:  Take over-the-counter and prescription medicines only as told by your health care provider.  Maintain a healthy diet. Some of your treatments might affect your appetite.  Consider joining a support group. This may help you learn to cope with the stress of having bladder cancer.  Tell your cancer care team if you develop side effects. They may be able to recommend ways to relieve them.  Keep all follow-up visits as told by your health care provider. This is important. Where to find more information  American Cancer Society: www.cancer.org  National Cancer Institute (NCI): www.cancer.gov Contact a health care provider if:  You have symptoms of a urinary tract infection. These include: ?   Fever. ? Chills. ? Weakness. ? Muscle aches. ? Abdominal pain. ? Frequent and intense urge to urinate. ? Burning feeling in the bladder or urethra during urination. Get help right away if:  There is blood in your urine.  You cannot urinate.  You have severe pain or other symptoms that do not go  away. Summary  Bladder cancer is an abnormal growth of tissue in the bladder.  This condition is diagnosed based on your medical history, a physical exam, urine tests, lab tests, imaging tests, and your symptoms.  Based on the stage of cancer, surgery, chemotherapy, or a combination of treatments may be recommended.  Consider joining a support group. This may help you learn to cope with the stress of having bladder cancer. This information is not intended to replace advice given to you by your health care provider. Make sure you discuss any questions you have with your health care provider. Document Revised: 01/01/2017 Document Reviewed: 12/24/2015 Elsevier Patient Education  2020 Elsevier Inc.  

## 2019-11-30 NOTE — Progress Notes (Signed)
Urological Symptom Review  Patient is experiencing the following symptoms: Get up at night to urinate   Review of Systems  Gastrointestinal (upper)  : Negative for upper GI symptoms  Gastrointestinal (lower) : Constipation  Constitutional : Negative for symptoms  Skin: Negative for skin symptoms  Eyes: Negative for eye symptoms  Ear/Nose/Throat : Sinus problems  Hematologic/Lymphatic: Negative for Hematologic/Lymphatic symptoms  Cardiovascular : Negative for cardiovascular symptoms  Respiratory : Negative for respiratory symptoms  Endocrine: Negative for endocrine symptoms  Musculoskeletal: Negative for musculoskeletal symptoms  Neurological: Negative for neurological symptoms  Psychologic: Negative for psychiatric symptoms

## 2020-02-06 ENCOUNTER — Other Ambulatory Visit: Payer: Self-pay | Admitting: Urology

## 2020-02-06 DIAGNOSIS — J329 Chronic sinusitis, unspecified: Secondary | ICD-10-CM | POA: Diagnosis not present

## 2020-02-06 DIAGNOSIS — E782 Mixed hyperlipidemia: Secondary | ICD-10-CM | POA: Diagnosis not present

## 2020-02-06 DIAGNOSIS — Z79899 Other long term (current) drug therapy: Secondary | ICD-10-CM | POA: Diagnosis not present

## 2020-02-06 DIAGNOSIS — I10 Essential (primary) hypertension: Secondary | ICD-10-CM | POA: Diagnosis not present

## 2020-02-06 DIAGNOSIS — J45909 Unspecified asthma, uncomplicated: Secondary | ICD-10-CM | POA: Diagnosis not present

## 2020-02-06 DIAGNOSIS — E7800 Pure hypercholesterolemia, unspecified: Secondary | ICD-10-CM | POA: Diagnosis not present

## 2020-02-06 DIAGNOSIS — Z0000 Encounter for general adult medical examination without abnormal findings: Secondary | ICD-10-CM | POA: Diagnosis not present

## 2020-02-06 DIAGNOSIS — Z23 Encounter for immunization: Secondary | ICD-10-CM | POA: Diagnosis not present

## 2020-02-06 DIAGNOSIS — E1169 Type 2 diabetes mellitus with other specified complication: Secondary | ICD-10-CM | POA: Diagnosis not present

## 2020-02-07 ENCOUNTER — Other Ambulatory Visit: Payer: Self-pay

## 2020-02-28 ENCOUNTER — Other Ambulatory Visit: Payer: Self-pay

## 2020-02-28 ENCOUNTER — Ambulatory Visit (INDEPENDENT_AMBULATORY_CARE_PROVIDER_SITE_OTHER): Payer: Medicare Other | Admitting: Urology

## 2020-02-28 VITALS — BP 150/84 | HR 65 | Temp 98.5°F | Ht 68.0 in | Wt 188.0 lb

## 2020-02-28 DIAGNOSIS — C679 Malignant neoplasm of bladder, unspecified: Secondary | ICD-10-CM | POA: Diagnosis not present

## 2020-02-28 LAB — URINALYSIS, ROUTINE W REFLEX MICROSCOPIC
Bilirubin, UA: NEGATIVE
Ketones, UA: NEGATIVE
Leukocytes,UA: NEGATIVE
Nitrite, UA: NEGATIVE
Protein,UA: NEGATIVE
RBC, UA: NEGATIVE
Specific Gravity, UA: 1.02 (ref 1.005–1.030)
Urobilinogen, Ur: 0.2 mg/dL (ref 0.2–1.0)
pH, UA: 5.5 (ref 5.0–7.5)

## 2020-02-28 MED ORDER — CIPROFLOXACIN HCL 500 MG PO TABS
500.0000 mg | ORAL_TABLET | Freq: Once | ORAL | Status: AC
  Administered 2020-02-28: 500 mg via ORAL

## 2020-02-28 NOTE — Progress Notes (Signed)
   02/28/20  CC: followup bladder cancer  HPI: Daniel Farley is a 70yo here for followup for bladder cancer. No worsening LUTS. No hematuria or dysuria.  Blood pressure (!) 150/84, pulse 65, temperature 98.5 F (36.9 C), height 5\' 8"  (1.727 m), weight 188 lb (85.3 kg). NED. A&Ox3.   No respiratory distress   Abd soft, NT, ND Normal phallus with bilateral descended testicles  Cystoscopy Procedure Note  Patient identification was confirmed, informed consent was obtained, and patient was prepped using Betadine solution.  Lidocaine jelly was administered per urethral meatus.     Pre-Procedure: - Inspection reveals a normal caliber ureteral meatus.  Procedure: The flexible cystoscope was introduced without difficulty - No urethral strictures/lesions are present. - Enlarged prostate  - Normal bladder neck - Bilateral ureteral orifices identified - Bladder mucosa  reveals no ulcers, tumors, or lesions - No bladder stones - No trabeculation   Post-Procedure: - Patient tolerated the procedure well  Assessment/ Plan:   Return in about 6 months (around 08/27/2020) for cystoscopy.  Nicolette Bang, MD

## 2020-02-28 NOTE — Progress Notes (Signed)
Urological Symptom Review  Patient is experiencing the following symptoms: Frequent urination   Review of Systems  Gastrointestinal (upper)  : Negative for upper GI symptoms  Gastrointestinal (lower) : Constipation  Constitutional : Negative for symptoms  Skin: Negative for skin symptoms  Eyes: Negative for eye symptoms  Ear/Nose/Throat : Sinus problems  Hematologic/Lymphatic: Negative for Hematologic/Lymphatic symptoms  Cardiovascular : Negative for cardiovascular symptoms  Respiratory : Negative for respiratory symptoms  Endocrine: Negative for endocrine symptoms  Musculoskeletal: Negative for musculoskeletal symptoms  Neurological: Negative for neurological symptoms  Psychologic: Negative for psychiatric symptoms

## 2020-03-05 DIAGNOSIS — J329 Chronic sinusitis, unspecified: Secondary | ICD-10-CM | POA: Diagnosis not present

## 2020-03-05 DIAGNOSIS — H9209 Otalgia, unspecified ear: Secondary | ICD-10-CM | POA: Diagnosis not present

## 2020-03-11 ENCOUNTER — Encounter: Payer: Self-pay | Admitting: Urology

## 2020-03-11 NOTE — Patient Instructions (Signed)
Bladder Cancer  Bladder cancer is a condition in which abnormal tissue (a tumor) grows in the bladder. The bladder is the organ that holds urine. Two tubes (ureters) carry the urine from the kidneys to the bladder. The bladder wall is made of layers of tissue. Cancer that spreads through these layers of the bladder wall becomes more difficult to treat. What are the causes? The cause of this condition is not known. What increases the risk? The following factors may make you more likely to develop this condition:  Smoking.  Working where there are risks (occupational exposures), such as working with rubber, leather, clothing fabric, dyes, chemicals, and paint.  Being 55 years of age or older.  Being male.  Having bladder inflammation that is long-term (chronic).  Having a history of cancer, including: ? A family history of bladder cancer. ? Personal experience with bladder cancer. ? Having had certain treatments for cancer before. These include:  Medicines to kill cancer cells (chemotherapy).  Strong X-ray beams or capsules high in energy to kill cancer cells and shrink tumors (radiation therapy).  Having been exposed to arsenic. This is a chemical element that can poison you. What are the signs or symptoms? Early symptoms of this condition include:  Seeing blood in your urine.  Feeling pain when urinating.  Having infections of your urinary system (urinary tract infections or UTIs) that happen often.  Having to urinate sooner or more often than usual. Later symptoms of this condition include:  Not being able to urinate.  Pain on one side of your lower back.  Loss of appetite.  Weight loss.  Tiredness (fatigue).  Swelling in your feet.  Bone pain. How is this diagnosed? This condition is diagnosed based on:  Your medical history.  A physical exam.  Lab tests, such as urine tests.  Imaging tests.  Your symptoms. You may also have other tests or  procedures done, such as:  A cystoscopy. A narrow tube is inserted into your bladder through the organ that connects your bladder to the outside of your body (urethra). This is done to view the lining of your bladder for tumors.  A biopsy. This procedure involves removing a tissue sample to look at it under a microscope to see if cancer is present. It is important to find out:  How deeply into the bladder wall cancer has grown.  Whether cancer has spread to any other parts of your body. This may require blood tests or imaging tests, such as a CT scan, MRI, bone scan, or X-rays. How is this treated? Your health care provider may recommend one or more types of treatment based on the stage of your cancer. The most common types of treatment are:  Surgery to remove the cancer. Procedures that may be done include: ? Removing a tumor on the inside wall of the bladder (transurethral resection). ? Removing the bladder (cystectomy).  Radiation therapy. This is often used together with chemotherapy.  Chemotherapy.  Immunotherapy. This uses medicines to help your immune system destroy cancer cells. Follow these instructions at home:  Take over-the-counter and prescription medicines only as told by your health care provider.  Eat a healthy diet. Some of your treatments might affect your appetite.  Do not use any products that contain nicotine or tobacco, such as cigarettes, e-cigarettes, and chewing tobacco. If you need help quitting, ask your health care provider.  Consider joining a support group. This may help you learn to cope with the stress of having   bladder cancer.  Tell your cancer care team if you develop side effects. Your team may be able to recommend ways to get relief.  Keep all follow-up visits as told by your health care provider. This is important. Where to find more information  American Cancer Society: www.cancer.org  National Cancer Institute (NCI):  www.cancer.gov Contact a health care provider if:  You have symptoms of a urinary tract infection. These include: ? Fever. ? Chills. ? Weakness. ? Muscle aches. ? Pain in your abdomen. ? Urge to urinate that is stronger and happens more often than usual. ? Burning feeling in the bladder or urethra when you urinate. Get help right away if:  There is blood in your urine.  You cannot urinate.  You have severe pain or other symptoms that do not go away. Summary  Bladder cancer is a condition in which tumors grow in the bladder and cause illness.  This condition is diagnosed based on your medical history, a physical exam, lab tests, imaging tests, and your symptoms.  Your health care provider may recommend one or more types of treatment based on the stage of your cancer.  Consider joining a support group. This may help you learn to cope with the stress of having bladder cancer. This information is not intended to replace advice given to you by your health care provider. Make sure you discuss any questions you have with your health care provider. Document Revised: 09/28/2018 Document Reviewed: 09/28/2018 Elsevier Patient Education  2021 Elsevier Inc.  

## 2020-03-15 DIAGNOSIS — E119 Type 2 diabetes mellitus without complications: Secondary | ICD-10-CM | POA: Diagnosis not present

## 2020-03-15 DIAGNOSIS — H524 Presbyopia: Secondary | ICD-10-CM | POA: Diagnosis not present

## 2020-03-15 DIAGNOSIS — H04123 Dry eye syndrome of bilateral lacrimal glands: Secondary | ICD-10-CM | POA: Diagnosis not present

## 2020-06-18 DIAGNOSIS — M2022 Hallux rigidus, left foot: Secondary | ICD-10-CM | POA: Diagnosis not present

## 2020-06-18 DIAGNOSIS — M21622 Bunionette of left foot: Secondary | ICD-10-CM | POA: Diagnosis not present

## 2020-06-18 DIAGNOSIS — M21621 Bunionette of right foot: Secondary | ICD-10-CM | POA: Diagnosis not present

## 2020-06-18 DIAGNOSIS — M792 Neuralgia and neuritis, unspecified: Secondary | ICD-10-CM | POA: Diagnosis not present

## 2020-07-25 DIAGNOSIS — L570 Actinic keratosis: Secondary | ICD-10-CM | POA: Diagnosis not present

## 2020-07-25 DIAGNOSIS — L820 Inflamed seborrheic keratosis: Secondary | ICD-10-CM | POA: Diagnosis not present

## 2020-07-25 DIAGNOSIS — D2239 Melanocytic nevi of other parts of face: Secondary | ICD-10-CM | POA: Diagnosis not present

## 2020-07-25 DIAGNOSIS — L578 Other skin changes due to chronic exposure to nonionizing radiation: Secondary | ICD-10-CM | POA: Diagnosis not present

## 2020-07-25 DIAGNOSIS — D485 Neoplasm of uncertain behavior of skin: Secondary | ICD-10-CM | POA: Diagnosis not present

## 2020-08-28 ENCOUNTER — Ambulatory Visit: Payer: Medicare Other | Admitting: Urology

## 2020-08-28 ENCOUNTER — Other Ambulatory Visit: Payer: Self-pay

## 2020-08-28 ENCOUNTER — Encounter: Payer: Self-pay | Admitting: Urology

## 2020-08-28 VITALS — BP 127/77 | HR 61

## 2020-08-28 DIAGNOSIS — C679 Malignant neoplasm of bladder, unspecified: Secondary | ICD-10-CM | POA: Diagnosis not present

## 2020-08-28 LAB — URINALYSIS, ROUTINE W REFLEX MICROSCOPIC
Bilirubin, UA: NEGATIVE
Glucose, UA: NEGATIVE
Ketones, UA: NEGATIVE
Leukocytes,UA: NEGATIVE
Nitrite, UA: NEGATIVE
Protein,UA: NEGATIVE
RBC, UA: NEGATIVE
Specific Gravity, UA: 1.02 (ref 1.005–1.030)
Urobilinogen, Ur: 0.2 mg/dL (ref 0.2–1.0)
pH, UA: 6 (ref 5.0–7.5)

## 2020-08-28 MED ORDER — CIPROFLOXACIN HCL 500 MG PO TABS
500.0000 mg | ORAL_TABLET | Freq: Once | ORAL | Status: AC
Start: 2020-08-28 — End: 2020-08-28
  Administered 2020-08-28: 500 mg via ORAL

## 2020-08-28 NOTE — Progress Notes (Signed)
   08/28/20  CC: followup bladder cancer   HPI: Daniel Farley is a 70yo here for followup for T1G3 bladder cancer diagnosed in 05/2018. No worsening LUTS. No hematuria.  Blood pressure 127/77, pulse 61. NED. A&Ox3.   No respiratory distress   Abd soft, NT, ND Normal phallus with bilateral descended testicles  Cystoscopy Procedure Note  Patient identification was confirmed, informed consent was obtained, and patient was prepped using Betadine solution.  Lidocaine jelly was administered per urethral meatus.     Pre-Procedure: - Inspection reveals a normal caliber ureteral meatus.  Procedure: The flexible cystoscope was introduced without difficulty - No urethral strictures/lesions are present. - Enlarged prostate  - Normal bladder neck - Bilateral ureteral orifices identified - Bladder mucosa  reveals no ulcers, tumors, or lesions - No bladder stones - No trabeculation  Retroflexion shows 1cm intravesical prostatic protrusion   Post-Procedure: - Patient tolerated the procedure well  Assessment/ Plan:  Followup 6 months for cystoscopy  Nicolette Bang, MD

## 2020-08-28 NOTE — Progress Notes (Signed)
Urological Symptom Review  Patient is experiencing the following symptoms: Frequent urination Get up at night to urinate   Review of Systems  Gastrointestinal (upper)  : Negative for upper GI symptoms  Gastrointestinal (lower) : Constipation  Constitutional : Negative for symptoms  Skin: Negative for skin symptoms  Eyes: Negative for eye symptoms  Ear/Nose/Throat : Sinus problems  Hematologic/Lymphatic: Negative for Hematologic/Lymphatic symptoms  Cardiovascular : Negative for cardiovascular symptoms  Respiratory : Negative for respiratory symptoms  Endocrine: Negative for endocrine symptoms  Musculoskeletal: Negative for musculoskeletal symptoms  Neurological: Negative for neurological symptoms  Psychologic: Negative for psychiatric symptoms

## 2020-08-28 NOTE — Patient Instructions (Signed)
Bladder Cancer Bladder cancer is a condition in which abnormal tissue (a tumor) grows in the bladder. The bladder is the organ that holds urine. Two tubes (ureters) carry the urine from the kidneys to the bladder. The bladder wall is made of layers of tissue. Cancer that spreads through these layers of the bladder wall becomes more difficult to treat. What are the causes? The cause of this condition is not known. What increases the risk? The following factors may make you more likely to develop this condition: Smoking. Working where there are risks (occupational exposures), such as working with rubber, leather, clothing fabric, dyes, chemicals, and paint. Being 68 years of age or older. Being male. Having bladder inflammation that is long-term (chronic). Having a history of cancer, including: A family history of bladder cancer. Personal experience with bladder cancer. Having had certain treatments for cancer before. These include: Medicines to kill cancer cells (chemotherapy). Strong X-ray beams or capsules high in energy to kill cancer cells and shrink tumors (radiation therapy). Having been exposed to arsenic. This is a chemical element that can poison you. What are the signs or symptoms? Early symptoms of this condition include: Seeing blood in your urine. Feeling pain when urinating. Having infections of your urinary system (urinary tract infections or UTIs) that happen often. Having to urinate sooner or more often than usual. Later symptoms of this condition include: Not being able to urinate. Pain on one side of your lower back. Loss of appetite. Weight loss. Tiredness (fatigue). Swelling in your feet. Bone pain. How is this diagnosed? This condition is diagnosed based on: Your medical history. A physical exam. Lab tests, such as urine tests. Imaging tests. Your symptoms. You may also have other tests or procedures done, such as: A cystoscopy. A narrow tube is inserted  into your bladder through the organ that connects your bladder to the outside of your body (urethra). This is done to view the lining of your bladder for tumors. A biopsy. This procedure involves removing a tissue sample to look at it under a microscope to see if cancer is present. It is important to find out: How deeply into the bladder wall cancer has grown. Whether cancer has spread to any other parts of your body. This may require blood tests or imaging tests, such as a CT scan, MRI, bone scan, or X-rays. How is this treated? Your health care provider may recommend one or more types of treatment based on the stage of your cancer. The most common types of treatment are: Surgery to remove the cancer. Procedures that may be done include: Removing a tumor on the inside wall of the bladder (transurethral resection). Removing the bladder (cystectomy). Radiation therapy. This is often used together with chemotherapy. Chemotherapy. Immunotherapy. This uses medicines to help your immune system destroy cancer cells. Follow these instructions at home: Take over-the-counter and prescription medicines only as told by your health care provider. Eat a healthy diet. Some of your treatments might affect your appetite. Do not use any products that contain nicotine or tobacco, such as cigarettes, e-cigarettes, and chewing tobacco. If you need help quitting, ask your health care provider. Consider joining a support group. This may help you learn to cope with the stress of having bladder cancer. Tell your cancer care team if you develop side effects. Your team may be able to recommend ways to get relief. Keep all follow-up visits as told by your health care provider. This is important. Where to find more information American   Cancer Society: www.cancer.org National Cancer Institute (NCI): www.cancer.gov Contact a health care provider if: You have symptoms of a urinary tract infection. These  include: Fever. Chills. Weakness. Muscle aches. Pain in your abdomen. Urge to urinate that is stronger and happens more often than usual. Burning feeling in the bladder or urethra when you urinate. Get help right away if: There is blood in your urine. You cannot urinate. You have severe pain or other symptoms that do not go away. Summary Bladder cancer is a condition in which tumors grow in the bladder and cause illness. This condition is diagnosed based on your medical history, a physical exam, lab tests, imaging tests, and your symptoms. Your health care provider may recommend one or more types of treatment based on the stage of your cancer. Consider joining a support group. This may help you learn to cope with the stress of having bladder cancer. This information is not intended to replace advice given to you by your health care provider. Make sure you discuss any questions you have with your health care provider. Document Revised: 09/28/2018 Document Reviewed: 09/28/2018 Elsevier Patient Education  2022 Elsevier Inc.  

## 2020-11-11 DIAGNOSIS — L210 Seborrhea capitis: Secondary | ICD-10-CM | POA: Diagnosis not present

## 2020-11-11 DIAGNOSIS — S60222A Contusion of left hand, initial encounter: Secondary | ICD-10-CM | POA: Diagnosis not present

## 2020-12-04 ENCOUNTER — Other Ambulatory Visit: Payer: Self-pay | Admitting: Urology

## 2021-02-10 DIAGNOSIS — Z8551 Personal history of malignant neoplasm of bladder: Secondary | ICD-10-CM | POA: Diagnosis not present

## 2021-02-10 DIAGNOSIS — E1169 Type 2 diabetes mellitus with other specified complication: Secondary | ICD-10-CM | POA: Diagnosis not present

## 2021-02-10 DIAGNOSIS — I10 Essential (primary) hypertension: Secondary | ICD-10-CM | POA: Diagnosis not present

## 2021-02-10 DIAGNOSIS — E7800 Pure hypercholesterolemia, unspecified: Secondary | ICD-10-CM | POA: Diagnosis not present

## 2021-02-10 DIAGNOSIS — Z0000 Encounter for general adult medical examination without abnormal findings: Secondary | ICD-10-CM | POA: Diagnosis not present

## 2021-02-10 DIAGNOSIS — E782 Mixed hyperlipidemia: Secondary | ICD-10-CM | POA: Diagnosis not present

## 2021-02-10 DIAGNOSIS — J329 Chronic sinusitis, unspecified: Secondary | ICD-10-CM | POA: Diagnosis not present

## 2021-02-10 DIAGNOSIS — Z79899 Other long term (current) drug therapy: Secondary | ICD-10-CM | POA: Diagnosis not present

## 2021-02-13 DIAGNOSIS — B351 Tinea unguium: Secondary | ICD-10-CM | POA: Diagnosis not present

## 2021-02-13 DIAGNOSIS — L84 Corns and callosities: Secondary | ICD-10-CM | POA: Diagnosis not present

## 2021-02-13 DIAGNOSIS — E1142 Type 2 diabetes mellitus with diabetic polyneuropathy: Secondary | ICD-10-CM | POA: Diagnosis not present

## 2021-02-26 ENCOUNTER — Ambulatory Visit (INDEPENDENT_AMBULATORY_CARE_PROVIDER_SITE_OTHER): Payer: Medicare Other | Admitting: Urology

## 2021-02-26 ENCOUNTER — Encounter: Payer: Self-pay | Admitting: Urology

## 2021-02-26 ENCOUNTER — Other Ambulatory Visit: Payer: Self-pay

## 2021-02-26 VITALS — BP 117/69 | HR 76

## 2021-02-26 DIAGNOSIS — C679 Malignant neoplasm of bladder, unspecified: Secondary | ICD-10-CM | POA: Diagnosis not present

## 2021-02-26 LAB — URINALYSIS, ROUTINE W REFLEX MICROSCOPIC
Bilirubin, UA: NEGATIVE
Glucose, UA: NEGATIVE
Ketones, UA: NEGATIVE
Leukocytes,UA: NEGATIVE
Nitrite, UA: NEGATIVE
Protein,UA: NEGATIVE
RBC, UA: NEGATIVE
Specific Gravity, UA: 1.015 (ref 1.005–1.030)
Urobilinogen, Ur: 0.2 mg/dL (ref 0.2–1.0)
pH, UA: 5.5 (ref 5.0–7.5)

## 2021-02-26 MED ORDER — CIPROFLOXACIN HCL 500 MG PO TABS
500.0000 mg | ORAL_TABLET | Freq: Once | ORAL | Status: AC
  Administered 2021-02-26: 10:00:00 500 mg via ORAL

## 2021-02-26 NOTE — Patient Instructions (Signed)
Bladder Cancer Bladder cancer is a condition in which abnormal tissue (a tumor) grows in the bladder. The bladder is the organ that holds urine. Two tubes (ureters) carry the urine from the kidneys to the bladder. The bladder wall is made of layers of tissue. Cancer that spreads through these layers of the bladder wall becomes more difficult to treat. What are the causes? The cause of this condition is not known. What increases the risk? The following factors may make you more likely to develop this condition: Smoking. Working where there are risks (occupational exposures), such as working with rubber, leather, clothing fabric, dyes, chemicals, and paint. Being 55 years of age or older. Being male. Having bladder inflammation that is long-term (chronic). Having a history of cancer, including: A family history of bladder cancer. Personal experience with bladder cancer. Having had certain treatments for cancer before. These include: Medicines to kill cancer cells (chemotherapy). Strong X-ray beams or capsules high in energy to kill cancer cells and shrink tumors (radiation therapy). Having been exposed to arsenic. This is a chemical element that can poison you. What are the signs or symptoms? Early symptoms of this condition include: Seeing blood in your urine. Feeling pain when urinating. Having infections of your urinary system (urinary tract infections or UTIs) that happen often. Having to urinate sooner or more often than usual. Later symptoms of this condition include: Not being able to urinate. Pain on one side of your lower back. Loss of appetite. Weight loss. Tiredness (fatigue). Swelling in your feet. Bone pain. How is this diagnosed? This condition is diagnosed based on: Your medical history. A physical exam. Lab tests, such as urine tests. Imaging tests. Your symptoms. You may also have other tests or procedures done, such as: A cystoscopy. A narrow tube is inserted  into your bladder through the organ that connects your bladder to the outside of your body (urethra). This is done to view the lining of your bladder for tumors. A biopsy. This procedure involves removing a tissue sample to look at it under a microscope to see if cancer is present. It is important to find out: How deeply into the bladder wall cancer has grown. Whether cancer has spread to any other parts of your body. This may require blood tests or imaging tests, such as a CT scan, MRI, bone scan, or X-rays. How is this treated? Your health care provider may recommend one or more types of treatment based on the stage of your cancer. The most common types of treatment are: Surgery to remove the cancer. Procedures that may be done include: Removing a tumor on the inside wall of the bladder (transurethral resection). Removing the bladder (cystectomy). Radiation therapy. This is often used together with chemotherapy. Chemotherapy. Immunotherapy. This uses medicines to help your immune system destroy cancer cells. Follow these instructions at home: Take over-the-counter and prescription medicines only as told by your health care provider. Eat a healthy diet. Some of your treatments might affect your appetite. Do not use any products that contain nicotine or tobacco, such as cigarettes, e-cigarettes, and chewing tobacco. If you need help quitting, ask your health care provider. Consider joining a support group. This may help you learn to cope with the stress of having bladder cancer. Tell your cancer care team if you develop side effects. Your team may be able to recommend ways to get relief. Keep all follow-up visits as told by your health care provider. This is important. Where to find more information American   Cancer Society: www.cancer.org National Cancer Institute (NCI): www.cancer.gov Contact a health care provider if: You have symptoms of a urinary tract infection. These  include: Fever. Chills. Weakness. Muscle aches. Pain in your abdomen. Urge to urinate that is stronger and happens more often than usual. Burning feeling in the bladder or urethra when you urinate. Get help right away if: There is blood in your urine. You cannot urinate. You have severe pain or other symptoms that do not go away. Summary Bladder cancer is a condition in which tumors grow in the bladder and cause illness. This condition is diagnosed based on your medical history, a physical exam, lab tests, imaging tests, and your symptoms. Your health care provider may recommend one or more types of treatment based on the stage of your cancer. Consider joining a support group. This may help you learn to cope with the stress of having bladder cancer. This information is not intended to replace advice given to you by your health care provider. Make sure you discuss any questions you have with your health care provider. Document Revised: 09/28/2018 Document Reviewed: 09/28/2018 Elsevier Patient Education  2022 Elsevier Inc.  

## 2021-02-26 NOTE — Progress Notes (Signed)
° °  02/26/21  CC: followup bladder cancer   HPI: Mr Tuzzolino is a 71yo here for followup for T1G3 bladder cancer diagnosed in 05/2018 Blood pressure 117/69, pulse 76. NED. A&Ox3.   No respiratory distress   Abd soft, NT, ND Normal phallus with bilateral descended testicles  Cystoscopy Procedure Note  Patient identification was confirmed, informed consent was obtained, and patient was prepped using Betadine solution.  Lidocaine jelly was administered per urethral meatus.     Pre-Procedure: - Inspection reveals a normal caliber ureteral meatus.  Procedure: The flexible cystoscope was introduced without difficulty - No urethral strictures/lesions are present. - Enlarged prostate  - Normal bladder neck - Bilateral ureteral orifices identified - Bladder mucosa  reveals no ulcers, tumors, or lesions - No bladder stones - No trabeculation  Retroflexion shows 1cm intravesical prostatatic protrusion   Post-Procedure: - Patient tolerated the procedure well  Assessment/ Plan: RTC 1 year with cystoscopy  No follow-ups on file.  Nicolette Bang, MD

## 2021-02-26 NOTE — Progress Notes (Signed)
Urological Symptom Review  Patient is experiencing the following symptoms: Get up at night to urinate   Review of Systems  Gastrointestinal (upper)  : Negative for upper GI symptoms  Gastrointestinal (lower) : Constipation  Constitutional : Negative for symptoms  Skin: Negative for skin symptoms  Eyes: Negative for eye symptoms  Ear/Nose/Throat : Sinus problems  Hematologic/Lymphatic: Negative for Hematologic/Lymphatic symptoms  Cardiovascular : Negative for cardiovascular symptoms  Respiratory : Negative for respiratory symptoms  Endocrine: Negative for endocrine symptoms  Musculoskeletal: Negative for musculoskeletal symptoms  Neurological: Negative for neurological symptoms  Psychologic: Negative for psychiatric symptoms

## 2021-03-17 DIAGNOSIS — E119 Type 2 diabetes mellitus without complications: Secondary | ICD-10-CM | POA: Diagnosis not present

## 2021-03-17 DIAGNOSIS — H04123 Dry eye syndrome of bilateral lacrimal glands: Secondary | ICD-10-CM | POA: Diagnosis not present

## 2021-03-17 DIAGNOSIS — H524 Presbyopia: Secondary | ICD-10-CM | POA: Diagnosis not present

## 2021-03-26 DIAGNOSIS — M2042 Other hammer toe(s) (acquired), left foot: Secondary | ICD-10-CM | POA: Diagnosis not present

## 2021-03-26 DIAGNOSIS — M792 Neuralgia and neuritis, unspecified: Secondary | ICD-10-CM | POA: Diagnosis not present

## 2021-03-26 DIAGNOSIS — L603 Nail dystrophy: Secondary | ICD-10-CM | POA: Diagnosis not present

## 2021-03-26 DIAGNOSIS — M21611 Bunion of right foot: Secondary | ICD-10-CM | POA: Diagnosis not present

## 2021-03-26 DIAGNOSIS — B351 Tinea unguium: Secondary | ICD-10-CM | POA: Diagnosis not present

## 2021-03-26 DIAGNOSIS — L565 Disseminated superficial actinic porokeratosis (DSAP): Secondary | ICD-10-CM | POA: Diagnosis not present

## 2021-03-26 DIAGNOSIS — L84 Corns and callosities: Secondary | ICD-10-CM | POA: Diagnosis not present

## 2021-03-26 DIAGNOSIS — M2041 Other hammer toe(s) (acquired), right foot: Secondary | ICD-10-CM | POA: Diagnosis not present

## 2021-04-29 DIAGNOSIS — H4311 Vitreous hemorrhage, right eye: Secondary | ICD-10-CM | POA: Diagnosis not present

## 2021-05-06 DIAGNOSIS — H33311 Horseshoe tear of retina without detachment, right eye: Secondary | ICD-10-CM | POA: Diagnosis not present

## 2021-05-06 DIAGNOSIS — H43822 Vitreomacular adhesion, left eye: Secondary | ICD-10-CM | POA: Diagnosis not present

## 2021-05-06 DIAGNOSIS — H4311 Vitreous hemorrhage, right eye: Secondary | ICD-10-CM | POA: Diagnosis not present

## 2021-05-06 DIAGNOSIS — H35033 Hypertensive retinopathy, bilateral: Secondary | ICD-10-CM | POA: Diagnosis not present

## 2021-05-06 DIAGNOSIS — H43811 Vitreous degeneration, right eye: Secondary | ICD-10-CM | POA: Diagnosis not present

## 2021-05-06 DIAGNOSIS — T1511XA Foreign body in conjunctival sac, right eye, initial encounter: Secondary | ICD-10-CM | POA: Diagnosis not present

## 2021-05-12 DIAGNOSIS — H4311 Vitreous hemorrhage, right eye: Secondary | ICD-10-CM | POA: Diagnosis not present

## 2021-05-12 DIAGNOSIS — H33311 Horseshoe tear of retina without detachment, right eye: Secondary | ICD-10-CM | POA: Diagnosis not present

## 2021-05-12 DIAGNOSIS — H31091 Other chorioretinal scars, right eye: Secondary | ICD-10-CM | POA: Diagnosis not present

## 2021-06-02 DIAGNOSIS — E1169 Type 2 diabetes mellitus with other specified complication: Secondary | ICD-10-CM | POA: Diagnosis not present

## 2021-06-02 DIAGNOSIS — E782 Mixed hyperlipidemia: Secondary | ICD-10-CM | POA: Diagnosis not present

## 2021-06-02 DIAGNOSIS — I10 Essential (primary) hypertension: Secondary | ICD-10-CM | POA: Diagnosis not present

## 2021-06-02 DIAGNOSIS — E7800 Pure hypercholesterolemia, unspecified: Secondary | ICD-10-CM | POA: Diagnosis not present

## 2021-06-02 DIAGNOSIS — H109 Unspecified conjunctivitis: Secondary | ICD-10-CM | POA: Diagnosis not present

## 2021-06-10 DIAGNOSIS — H33311 Horseshoe tear of retina without detachment, right eye: Secondary | ICD-10-CM | POA: Diagnosis not present

## 2021-06-10 DIAGNOSIS — H4311 Vitreous hemorrhage, right eye: Secondary | ICD-10-CM | POA: Diagnosis not present

## 2021-06-10 DIAGNOSIS — H43811 Vitreous degeneration, right eye: Secondary | ICD-10-CM | POA: Diagnosis not present

## 2021-06-10 DIAGNOSIS — H31091 Other chorioretinal scars, right eye: Secondary | ICD-10-CM | POA: Diagnosis not present

## 2021-06-23 DIAGNOSIS — M545 Low back pain, unspecified: Secondary | ICD-10-CM | POA: Diagnosis not present

## 2021-06-23 DIAGNOSIS — M25562 Pain in left knee: Secondary | ICD-10-CM | POA: Diagnosis not present

## 2021-06-23 DIAGNOSIS — M25561 Pain in right knee: Secondary | ICD-10-CM | POA: Diagnosis not present

## 2021-06-23 DIAGNOSIS — M25551 Pain in right hip: Secondary | ICD-10-CM | POA: Diagnosis not present

## 2021-06-26 DIAGNOSIS — M25561 Pain in right knee: Secondary | ICD-10-CM | POA: Diagnosis not present

## 2021-07-03 DIAGNOSIS — E1142 Type 2 diabetes mellitus with diabetic polyneuropathy: Secondary | ICD-10-CM | POA: Diagnosis not present

## 2021-07-03 DIAGNOSIS — L565 Disseminated superficial actinic porokeratosis (DSAP): Secondary | ICD-10-CM | POA: Diagnosis not present

## 2021-07-03 DIAGNOSIS — L84 Corns and callosities: Secondary | ICD-10-CM | POA: Diagnosis not present

## 2021-07-03 DIAGNOSIS — B351 Tinea unguium: Secondary | ICD-10-CM | POA: Diagnosis not present

## 2021-07-07 DIAGNOSIS — M25561 Pain in right knee: Secondary | ICD-10-CM | POA: Diagnosis not present

## 2021-07-07 DIAGNOSIS — M542 Cervicalgia: Secondary | ICD-10-CM | POA: Diagnosis not present

## 2021-07-11 DIAGNOSIS — M6281 Muscle weakness (generalized): Secondary | ICD-10-CM | POA: Diagnosis not present

## 2021-07-11 DIAGNOSIS — M542 Cervicalgia: Secondary | ICD-10-CM | POA: Diagnosis not present

## 2021-07-14 DIAGNOSIS — M6281 Muscle weakness (generalized): Secondary | ICD-10-CM | POA: Diagnosis not present

## 2021-07-14 DIAGNOSIS — M542 Cervicalgia: Secondary | ICD-10-CM | POA: Diagnosis not present

## 2021-07-17 DIAGNOSIS — S83271A Complex tear of lateral meniscus, current injury, right knee, initial encounter: Secondary | ICD-10-CM | POA: Diagnosis not present

## 2021-07-17 DIAGNOSIS — M94261 Chondromalacia, right knee: Secondary | ICD-10-CM | POA: Diagnosis not present

## 2021-07-17 DIAGNOSIS — S83241A Other tear of medial meniscus, current injury, right knee, initial encounter: Secondary | ICD-10-CM | POA: Diagnosis not present

## 2021-07-17 DIAGNOSIS — G8918 Other acute postprocedural pain: Secondary | ICD-10-CM | POA: Diagnosis not present

## 2021-07-17 DIAGNOSIS — S83281A Other tear of lateral meniscus, current injury, right knee, initial encounter: Secondary | ICD-10-CM | POA: Diagnosis not present

## 2021-07-17 DIAGNOSIS — S83231A Complex tear of medial meniscus, current injury, right knee, initial encounter: Secondary | ICD-10-CM | POA: Diagnosis not present

## 2021-07-25 DIAGNOSIS — S83241D Other tear of medial meniscus, current injury, right knee, subsequent encounter: Secondary | ICD-10-CM | POA: Diagnosis not present

## 2021-07-25 DIAGNOSIS — S83281D Other tear of lateral meniscus, current injury, right knee, subsequent encounter: Secondary | ICD-10-CM | POA: Diagnosis not present

## 2021-08-04 DIAGNOSIS — H43811 Vitreous degeneration, right eye: Secondary | ICD-10-CM | POA: Diagnosis not present

## 2021-08-04 DIAGNOSIS — H4311 Vitreous hemorrhage, right eye: Secondary | ICD-10-CM | POA: Diagnosis not present

## 2021-08-04 DIAGNOSIS — H43822 Vitreomacular adhesion, left eye: Secondary | ICD-10-CM | POA: Diagnosis not present

## 2021-08-04 DIAGNOSIS — H31091 Other chorioretinal scars, right eye: Secondary | ICD-10-CM | POA: Diagnosis not present

## 2021-08-28 DIAGNOSIS — L820 Inflamed seborrheic keratosis: Secondary | ICD-10-CM | POA: Diagnosis not present

## 2021-08-28 DIAGNOSIS — D225 Melanocytic nevi of trunk: Secondary | ICD-10-CM | POA: Diagnosis not present

## 2021-08-28 DIAGNOSIS — L578 Other skin changes due to chronic exposure to nonionizing radiation: Secondary | ICD-10-CM | POA: Diagnosis not present

## 2021-08-28 DIAGNOSIS — L821 Other seborrheic keratosis: Secondary | ICD-10-CM | POA: Diagnosis not present

## 2021-12-08 DIAGNOSIS — H43811 Vitreous degeneration, right eye: Secondary | ICD-10-CM | POA: Diagnosis not present

## 2021-12-08 DIAGNOSIS — H4311 Vitreous hemorrhage, right eye: Secondary | ICD-10-CM | POA: Diagnosis not present

## 2022-02-12 DIAGNOSIS — E782 Mixed hyperlipidemia: Secondary | ICD-10-CM | POA: Diagnosis not present

## 2022-02-12 DIAGNOSIS — I10 Essential (primary) hypertension: Secondary | ICD-10-CM | POA: Diagnosis not present

## 2022-02-12 DIAGNOSIS — E1169 Type 2 diabetes mellitus with other specified complication: Secondary | ICD-10-CM | POA: Diagnosis not present

## 2022-02-12 DIAGNOSIS — Z0000 Encounter for general adult medical examination without abnormal findings: Secondary | ICD-10-CM | POA: Diagnosis not present

## 2022-02-12 DIAGNOSIS — J45909 Unspecified asthma, uncomplicated: Secondary | ICD-10-CM | POA: Diagnosis not present

## 2022-02-12 DIAGNOSIS — Z23 Encounter for immunization: Secondary | ICD-10-CM | POA: Diagnosis not present

## 2022-02-12 DIAGNOSIS — Z79899 Other long term (current) drug therapy: Secondary | ICD-10-CM | POA: Diagnosis not present

## 2022-02-12 DIAGNOSIS — E7800 Pure hypercholesterolemia, unspecified: Secondary | ICD-10-CM | POA: Diagnosis not present

## 2022-03-04 ENCOUNTER — Ambulatory Visit: Payer: Medicare Other | Admitting: Urology

## 2022-03-04 VITALS — BP 122/75 | HR 61

## 2022-03-04 DIAGNOSIS — C679 Malignant neoplasm of bladder, unspecified: Secondary | ICD-10-CM

## 2022-03-04 DIAGNOSIS — R351 Nocturia: Secondary | ICD-10-CM

## 2022-03-04 DIAGNOSIS — N138 Other obstructive and reflux uropathy: Secondary | ICD-10-CM | POA: Diagnosis not present

## 2022-03-04 DIAGNOSIS — N401 Enlarged prostate with lower urinary tract symptoms: Secondary | ICD-10-CM | POA: Diagnosis not present

## 2022-03-04 LAB — URINALYSIS, ROUTINE W REFLEX MICROSCOPIC
Bilirubin, UA: NEGATIVE
Glucose, UA: NEGATIVE
Ketones, UA: NEGATIVE
Leukocytes,UA: NEGATIVE
Nitrite, UA: NEGATIVE
Protein,UA: NEGATIVE
RBC, UA: NEGATIVE
Specific Gravity, UA: 1.015 (ref 1.005–1.030)
Urobilinogen, Ur: 0.2 mg/dL (ref 0.2–1.0)
pH, UA: 6 (ref 5.0–7.5)

## 2022-03-04 MED ORDER — SILODOSIN 8 MG PO CAPS: 8.0000 mg | ORAL_CAPSULE | Freq: Every day | ORAL | 11 refills | Status: DC

## 2022-03-04 MED ORDER — CIPROFLOXACIN HCL 500 MG PO TABS
500.0000 mg | ORAL_TABLET | Freq: Once | ORAL | Status: AC
  Administered 2022-03-04: 500 mg via ORAL

## 2022-03-04 NOTE — Progress Notes (Signed)
03/04/2022 10:17 AM   Daniel Farley Jun 04, 1950 [id number]  Referring provider: Mateo Flow, MD [place],  [place] [id number]  Followup BPH and bladder cancer   HPI: Mr Daniel Farley is a 72yo here for followup for BPH and bladder cancer. IPSS 12 QOL 2 on rapalfo '8mg'$ . Nocturia 2x. Urine stream strong. No straining to urinate.    PMH: Past Medical History:  Diagnosis Date   Asthma    bronchial-well controlled   Cancer ([place])    Bladder   Family history of adverse reaction to anesthesia    mother has a hard time waking up from Anesthesia   GERD (gastroesophageal reflux disease)    mild, on no preventative meds   Hypertension    Pre-diabetes    will be started on meds likely at next md vist, A1C 8.8    Surgical History: Past Surgical History:  Procedure Laterality Date   BUNIONECTOMY Right 2014   COLONOSCOPY  2009,2014,2019   CYSTOSCOPY W/ RETROGRADES Bilateral 04/28/2018   Procedure: CYSTOSCOPY WITH RETROGRADE PYELOGRAM;  Surgeon: Cleon Gustin, MD;  Location: [facility];  Service: Urology;  Laterality: Bilateral;   NASAL SINUS SURGERY  1990s   TRANSURETHRAL RESECTION OF BLADDER TUMOR N/A 05/30/2018   Procedure: TRANSURETHRAL RESECTION OF BLADDER TUMOR (TURBT);  Surgeon: Cleon Gustin, MD;  Location: [facility];  Service: Urology;  Laterality: N/A;  [place] TUMOR WITH MITOMYCIN-C N/A 04/28/2018   Procedure: TRANSURETHRAL RESECTION OF BLADDER TUMOR WITH MITOMYCIN-C;  Surgeon: Cleon Gustin, MD;  Location: [facility];  Service: Urology;  Laterality: N/A;    Home Medications:  Allergies as of 03/04/2022       Reactions   Augmentin [amoxicillin-pot Clavulanate] Diarrhea, Nausea And Vomiting   Did it involve swelling of the face/tongue/throat, SOB, or low BP? No Did it involve sudden or severe rash/hives, skin peeling, or any reaction on the inside of your mouth or nose? No Did you need to seek medical attention at a  hospital or doctor's office? No When did it last happen? 3-4 years ago      If all above answers are "NO", may proceed with cephalosporin use.        Medication List        Accurate as of March 04, 2022 10:17 AM. If you have any questions, ask your nurse or doctor.          alfuzosin 10 MG 24 hr tablet Commonly known as: UROXATRAL TAKE 1 TABLET BY MOUTH  EVERY NIGHT AT BEDTIME   ascorbic acid 500 MG tablet Commonly known as: VITAMIN C Take 500 mg by mouth daily.   budesonide-formoterol 160-4.5 MCG/ACT inhaler Commonly known as: SYMBICORT Inhale 2 puffs into the lungs 2 (two) times daily.   cholecalciferol 25 MCG (1000 UNIT) tablet Commonly known as: VITAMIN D3 Take 1,000 Units by mouth daily.   docusate sodium 100 MG capsule Commonly known as: COLACE Take 100 mg by mouth 2 (two) times daily.   fluticasone 50 MCG/ACT nasal spray Commonly known as: FLONASE Place 2 sprays into both nostrils 2 (two) times daily.   hydroxypropyl methylcellulose / hypromellose 2.5 % ophthalmic solution Commonly known as: ISOPTO TEARS / GONIOVISC Place 1 drop into both eyes 4 (four) times daily as needed for dry eyes.   Lifitegrast 5 % Soln Place 2 drops into both eyes 2 (two) times daily.   lisinopril 5 MG tablet Commonly known as: ZESTRIL Take  5 mg by mouth daily.   Lutein 20 MG Caps Take 1 capsule by mouth daily.   metFORMIN 750 MG 24 hr tablet Commonly known as: GLUCOPHAGE-XR Take 750 mg by mouth daily.   montelukast 10 MG tablet Commonly known as: SINGULAIR Take 10 mg by mouth at bedtime.   multivitamin with minerals Tabs tablet Take 1 tablet by mouth daily.   OneTouch Verio test strip Generic drug: glucose blood 1 each daily.   phenazopyridine 100 MG tablet Commonly known as: Pyridium Take 1 tablet (100 mg total) by mouth 3 (three) times daily as needed for pain.   polyethylene glycol powder 17 GM/SCOOP powder Commonly known as: GLYCOLAX/MIRALAX Take 17 g  by mouth daily as needed (constipation).   Restasis 0.05 % ophthalmic emulsion Generic drug: cycloSPORINE   simvastatin 40 MG tablet Commonly known as: ZOCOR Take 40 mg by mouth at bedtime.   Systane Nighttime Oint Place 1 application into both eyes at bedtime.        Allergies:  Allergies  Allergen Reactions   Augmentin [Amoxicillin-Pot Clavulanate] Diarrhea and Nausea And Vomiting    Did it involve swelling of the face/tongue/throat, SOB, or low BP? No Did it involve sudden or severe rash/hives, skin peeling, or any reaction on the inside of your mouth or nose? No Did you need to seek medical attention at a hospital or doctor's office? No When did it last happen? 3-4 years ago      If all above answers are "NO", may proceed with cephalosporin use.     Family History: No family history on file.  Social History:  reports that he quit smoking about 39 years ago. His smoking use included cigarettes. He has a 3.50 pack-year smoking history. He quit smokeless tobacco use about 54 years ago.  His smokeless tobacco use included chew. He reports that he does not use drugs. No history on file for alcohol use.  ROS: All other review of systems were reviewed and are negative except what is noted above in HPI  Physical Exam: BP 122/75   Pulse 61   Constitutional:  Alert and oriented, No acute distress. HEENT: [place] AT, moist mucus membranes.  Trachea midline, no masses. Cardiovascular: No clubbing, cyanosis, or edema. Respiratory: Normal respiratory effort, no increased work of breathing. GI: Abdomen is soft, nontender, nondistended, no abdominal masses GU: No CVA tenderness.  Lymph: No cervical or inguinal lymphadenopathy. Skin: No rashes, bruises or suspicious lesions. Neurologic: Grossly intact, no focal deficits, moving all 4 extremities. Psychiatric: Normal mood and affect.  Laboratory Data: No results found for: "WBC", "HGB", "HCT", "MCV", "PLT"  Lab Results  Component  Value Date   CREATININE 0.66 04/28/2018    No results found for: "PSA"  No results found for: "TESTOSTERONE"  No results found for: "HGBA1C"  Urinalysis    Component Value Date/Time   APPEARANCEUR Clear 02/26/2021 0948   GLUCOSEU Negative 02/26/2021 0948   BILIRUBINUR Negative 02/26/2021 0948   PROTEINUR Negative 02/26/2021 0948   NITRITE Negative 02/26/2021 0948   LEUKOCYTESUR Negative 02/26/2021 0948    Lab Results  Component Value Date   LABMICR Comment 02/26/2021    Pertinent Imaging:  No results found for this or any previous visit.  No results found for this or any previous visit.  No results found for this or any previous visit.  No results found for this or any previous visit.  No results found for this or any previous visit.  No valid procedures specified. No  results found for this or any previous visit.  No results found for this or any previous visit.   Assessment & Plan:    1. Malignant neoplasm of urinary bladder, unspecified site [facility]) -followup 1 year for cystoscopy - Urinalysis, Routine w reflex microscopic - ciprofloxacin (CIPRO) tablet 500 mg  2. Benign prostatic hyperplasia with urinary obstruction -we will trial rapaflo '8mg'$  daily  3. Nocturia -continue rapaflo '8mg'$  daily   No follow-ups on file.  Nicolette Bang, MD  [facility] [place]     Cystoscopy Procedure Note  Patient identification was confirmed, informed consent was obtained, and patient was prepped using Betadine solution.  Lidocaine jelly was administered per urethral meatus.     Pre-Procedure: - Inspection reveals a normal caliber ureteral meatus.  Procedure: The flexible cystoscope was introduced without difficulty - No urethral strictures/lesions are present. - Enlarged prostate  - Normal bladder neck - Bilateral ureteral orifices identified - Bladder mucosa  reveals no ulcers, tumors, or lesions - No bladder stones - No  trabeculation   Post-Procedure: - Patient tolerated the procedure well  Assessment/ Plan:   Return in about 1 year (around 03/05/2023) for cystoscopy.  Nicolette Bang, MD

## 2022-03-06 ENCOUNTER — Encounter: Payer: Self-pay | Admitting: Urology

## 2022-03-06 ENCOUNTER — Other Ambulatory Visit: Payer: Self-pay

## 2022-03-06 MED ORDER — SILODOSIN 8 MG PO CAPS: 8.0000 mg | ORAL_CAPSULE | Freq: Every day | ORAL | 11 refills | Status: DC

## 2022-03-06 NOTE — Patient Instructions (Signed)
Benign Prostatic Hyperplasia ? ?Benign prostatic hyperplasia (BPH) is an enlarged prostate gland that is caused by the normal aging process. The prostate may get bigger as a man gets older. The condition is not caused by cancer. The prostate is a walnut-sized gland that is involved in the production of semen. It is located in front of the rectum and below the bladder. The bladder stores urine. The urethra carries stored urine out of the body. ?An enlarged prostate can press on the urethra. This can make it harder to pass urine. The buildup of urine in the bladder can cause infection. Back pressure and infection may progress to bladder damage and kidney (renal) failure. ?What are the causes? ?This condition is part of the normal aging process. However, not all men develop problems from this condition. If the prostate enlarges away from the urethra, urine flow will not be blocked. If it enlarges toward the urethra and compresses it, there will be problems passing urine. ?What increases the risk? ?This condition is more likely to develop in men older than 50 years. ?What are the signs or symptoms? ?Symptoms of this condition include: ?Getting up often during the night to urinate. ?Needing to urinate frequently during the day. ?Difficulty starting urine flow. ?Decrease in size and strength of your urine stream. ?Leaking (dribbling) after urinating. ?Inability to pass urine. This needs immediate treatment. ?Inability to completely empty your bladder. ?Pain when you pass urine. This is more common if there is also an infection. ?Urinary tract infection (UTI). ?How is this diagnosed? ?This condition is diagnosed based on your medical history, a physical exam, and your symptoms. Tests will also be done, such as: ?A post-void bladder scan. This measures any amount of urine that may remain in your bladder after you finish urinating. ?A digital rectal exam. In a rectal exam, your health care provider checks your prostate by  putting a lubricated, gloved finger into your rectum to feel the back of your prostate gland. This exam detects the size of your gland and any abnormal lumps or growths. ?An exam of your urine (urinalysis). ?A prostate specific antigen (PSA) screening. This is a blood test used to screen for prostate cancer. ?An ultrasound. This test uses sound waves to electronically produce a picture of your prostate gland. ?Your health care provider may refer you to a specialist in kidney and prostate diseases (urologist). ?How is this treated? ?Once symptoms begin, your health care provider will monitor your condition (active surveillance or watchful waiting). Treatment for this condition will depend on the severity of your condition. Treatment may include: ?Observation and yearly exams. This may be the only treatment needed if your condition and symptoms are mild. ?Medicines to relieve your symptoms, including: ?Medicines to shrink the prostate. ?Medicines to relax the muscle of the prostate. ?Surgery in severe cases. Surgery may include: ?Prostatectomy. In this procedure, the prostate tissue is removed completely through an open incision or with a laparoscope or robotics. ?Transurethral resection of the prostate (TURP). In this procedure, a tool is inserted through the opening at the tip of the penis (urethra). It is used to cut away tissue of the inner core of the prostate. The pieces are removed through the same opening of the penis. This removes the blockage. ?Transurethral incision (TUIP). In this procedure, small cuts are made in the prostate. This lessens the prostate's pressure on the urethra. ?Transurethral microwave thermotherapy (TUMT). This procedure uses microwaves to create heat. The heat destroys and removes a small amount of   prostate tissue. ?Transurethral needle ablation (TUNA). This procedure uses radio frequencies to destroy and remove a small amount of prostate tissue. ?Interstitial laser coagulation (ILC).  This procedure uses a laser to destroy and remove a small amount of prostate tissue. ?Transurethral electrovaporization (TUVP). This procedure uses electrodes to destroy and remove a small amount of prostate tissue. ?Prostatic urethral lift. This procedure inserts an implant to push the lobes of the prostate away from the urethra. ?Follow these instructions at home: ?Take over-the-counter and prescription medicines only as told by your health care provider. ?Monitor your symptoms for any changes. Contact your health care provider with any changes. ?Avoid drinking large amounts of liquid before going to bed or out in public. ?Avoid or reduce how much caffeine or alcohol you drink. ?Give yourself time when you urinate. ?Keep all follow-up visits. This is important. ?Contact a health care provider if: ?You have unexplained back pain. ?Your symptoms do not get better with treatment. ?You develop side effects from the medicine you are taking. ?Your urine becomes very dark or has a bad smell. ?Your lower abdomen becomes distended and you have trouble passing urine. ?Get help right away if: ?You have a fever or chills. ?You suddenly cannot urinate. ?You feel light-headed or very dizzy, or you faint. ?There are large amounts of blood or clots in your urine. ?Your urinary problems become hard to manage. ?You develop moderate to severe low back or flank pain. The flank is the side of your body between the ribs and the hip. ?These symptoms may be an emergency. Get help right away. Call 911. ?Do not wait to see if the symptoms will go away. ?Do not drive yourself to the hospital. ?Summary ?Benign prostatic hyperplasia (BPH) is an enlarged prostate that is caused by the normal aging process. It is not caused by cancer. ?An enlarged prostate can press on the urethra. This can make it hard to pass urine. ?This condition is more likely to develop in men older than 50 years. ?Get help right away if you suddenly cannot urinate. ?This  information is not intended to replace advice given to you by your health care provider. Make sure you discuss any questions you have with your health care provider. ?Document Revised: 08/07/2020 Document Reviewed: 08/07/2020 ?Elsevier Patient Education ? 2023 [employer] ? ?

## 2022-03-27 DIAGNOSIS — H2513 Age-related nuclear cataract, bilateral: Secondary | ICD-10-CM | POA: Diagnosis not present

## 2022-03-27 DIAGNOSIS — H5201 Hypermetropia, right eye: Secondary | ICD-10-CM | POA: Diagnosis not present

## 2022-03-27 DIAGNOSIS — E119 Type 2 diabetes mellitus without complications: Secondary | ICD-10-CM | POA: Diagnosis not present

## 2022-07-16 DIAGNOSIS — H0100B Unspecified blepharitis left eye, upper and lower eyelids: Secondary | ICD-10-CM | POA: Diagnosis not present

## 2022-08-17 DIAGNOSIS — H16223 Keratoconjunctivitis sicca, not specified as Sjogren's, bilateral: Secondary | ICD-10-CM | POA: Diagnosis not present

## 2022-08-17 DIAGNOSIS — H00014 Hordeolum externum left upper eyelid: Secondary | ICD-10-CM | POA: Diagnosis not present

## 2022-08-26 DIAGNOSIS — M25512 Pain in left shoulder: Secondary | ICD-10-CM | POA: Diagnosis not present

## 2022-08-31 DIAGNOSIS — L570 Actinic keratosis: Secondary | ICD-10-CM | POA: Diagnosis not present

## 2022-08-31 DIAGNOSIS — L820 Inflamed seborrheic keratosis: Secondary | ICD-10-CM | POA: Diagnosis not present

## 2022-08-31 DIAGNOSIS — L821 Other seborrheic keratosis: Secondary | ICD-10-CM | POA: Diagnosis not present

## 2022-08-31 DIAGNOSIS — L578 Other skin changes due to chronic exposure to nonionizing radiation: Secondary | ICD-10-CM | POA: Diagnosis not present

## 2022-09-07 DIAGNOSIS — Z79899 Other long term (current) drug therapy: Secondary | ICD-10-CM | POA: Diagnosis not present

## 2022-09-07 DIAGNOSIS — E782 Mixed hyperlipidemia: Secondary | ICD-10-CM | POA: Diagnosis not present

## 2022-09-07 DIAGNOSIS — E1169 Type 2 diabetes mellitus with other specified complication: Secondary | ICD-10-CM | POA: Diagnosis not present

## 2022-09-07 DIAGNOSIS — E7800 Pure hypercholesterolemia, unspecified: Secondary | ICD-10-CM | POA: Diagnosis not present

## 2022-10-07 DIAGNOSIS — M25512 Pain in left shoulder: Secondary | ICD-10-CM | POA: Diagnosis not present

## 2022-10-26 DIAGNOSIS — G4762 Sleep related leg cramps: Secondary | ICD-10-CM | POA: Diagnosis not present

## 2022-10-26 DIAGNOSIS — E7800 Pure hypercholesterolemia, unspecified: Secondary | ICD-10-CM | POA: Diagnosis not present

## 2022-10-26 DIAGNOSIS — Z9181 History of falling: Secondary | ICD-10-CM | POA: Diagnosis not present

## 2022-12-14 DIAGNOSIS — H43811 Vitreous degeneration, right eye: Secondary | ICD-10-CM | POA: Diagnosis not present

## 2022-12-14 DIAGNOSIS — E119 Type 2 diabetes mellitus without complications: Secondary | ICD-10-CM | POA: Diagnosis not present

## 2022-12-14 DIAGNOSIS — H35033 Hypertensive retinopathy, bilateral: Secondary | ICD-10-CM | POA: Diagnosis not present

## 2022-12-14 DIAGNOSIS — H43822 Vitreomacular adhesion, left eye: Secondary | ICD-10-CM | POA: Diagnosis not present

## 2022-12-14 DIAGNOSIS — H31091 Other chorioretinal scars, right eye: Secondary | ICD-10-CM | POA: Diagnosis not present

## 2022-12-14 DIAGNOSIS — H2513 Age-related nuclear cataract, bilateral: Secondary | ICD-10-CM | POA: Diagnosis not present

## 2022-12-23 DIAGNOSIS — Z23 Encounter for immunization: Secondary | ICD-10-CM | POA: Diagnosis not present

## 2023-02-16 DIAGNOSIS — E1169 Type 2 diabetes mellitus with other specified complication: Secondary | ICD-10-CM | POA: Diagnosis not present

## 2023-02-16 DIAGNOSIS — E7800 Pure hypercholesterolemia, unspecified: Secondary | ICD-10-CM | POA: Diagnosis not present

## 2023-02-16 DIAGNOSIS — Z79899 Other long term (current) drug therapy: Secondary | ICD-10-CM | POA: Diagnosis not present

## 2023-02-23 DIAGNOSIS — Z8551 Personal history of malignant neoplasm of bladder: Secondary | ICD-10-CM | POA: Diagnosis not present

## 2023-02-23 DIAGNOSIS — I10 Essential (primary) hypertension: Secondary | ICD-10-CM | POA: Diagnosis not present

## 2023-02-23 DIAGNOSIS — E1169 Type 2 diabetes mellitus with other specified complication: Secondary | ICD-10-CM | POA: Diagnosis not present

## 2023-02-23 DIAGNOSIS — E7800 Pure hypercholesterolemia, unspecified: Secondary | ICD-10-CM | POA: Diagnosis not present

## 2023-02-23 DIAGNOSIS — Z0000 Encounter for general adult medical examination without abnormal findings: Secondary | ICD-10-CM | POA: Diagnosis not present

## 2023-02-23 DIAGNOSIS — Z9181 History of falling: Secondary | ICD-10-CM | POA: Diagnosis not present

## 2023-02-23 DIAGNOSIS — E782 Mixed hyperlipidemia: Secondary | ICD-10-CM | POA: Diagnosis not present

## 2023-03-08 ENCOUNTER — Other Ambulatory Visit: Payer: Medicare Other | Admitting: Urology

## 2023-03-10 ENCOUNTER — Ambulatory Visit: Payer: Medicare Other | Admitting: Urology

## 2023-03-10 VITALS — BP 129/81 | HR 67

## 2023-03-10 DIAGNOSIS — Z8551 Personal history of malignant neoplasm of bladder: Secondary | ICD-10-CM | POA: Diagnosis not present

## 2023-03-10 DIAGNOSIS — C679 Malignant neoplasm of bladder, unspecified: Secondary | ICD-10-CM

## 2023-03-10 DIAGNOSIS — R351 Nocturia: Secondary | ICD-10-CM | POA: Diagnosis not present

## 2023-03-10 DIAGNOSIS — N138 Other obstructive and reflux uropathy: Secondary | ICD-10-CM | POA: Diagnosis not present

## 2023-03-10 DIAGNOSIS — N401 Enlarged prostate with lower urinary tract symptoms: Secondary | ICD-10-CM | POA: Diagnosis not present

## 2023-03-10 LAB — MICROSCOPIC EXAMINATION
Bacteria, UA: NONE SEEN
RBC, Urine: NONE SEEN /[HPF] (ref 0–2)

## 2023-03-10 LAB — URINALYSIS, ROUTINE W REFLEX MICROSCOPIC
Bilirubin, UA: NEGATIVE
Ketones, UA: NEGATIVE
Nitrite, UA: NEGATIVE
Protein,UA: NEGATIVE
RBC, UA: NEGATIVE
Specific Gravity, UA: 1.03 (ref 1.005–1.030)
Urobilinogen, Ur: 0.2 mg/dL (ref 0.2–1.0)
pH, UA: 5.5 (ref 5.0–7.5)

## 2023-03-10 MED ORDER — ALFUZOSIN HCL ER 10 MG PO TB24: 10.0000 mg | ORAL_TABLET | Freq: Every day | ORAL | 3 refills | Status: AC

## 2023-03-10 MED ORDER — CIPROFLOXACIN HCL 500 MG PO TABS
500.0000 mg | ORAL_TABLET | Freq: Once | ORAL | Status: AC
  Administered 2023-03-10: 500 mg via ORAL

## 2023-03-10 NOTE — Progress Notes (Signed)
 03/10/2023 11:01 AM   Daniel Farley [birth year] [id number]  Referring provider: Fernand Tracey LABOR, MD [address] [place],  [place] [address]  nocturia  HPI: Mr Huot is a 72yo here for followup for BPH with nocturia and bladder cancer. IPSS 8 QOl 1 on uroxatral  10mg  at bedtime. Nocturia 1x. Urine stream strong. O straining to urinate. No urinary hesitancy. No hematuria or dysuria   PMH: Past Medical History:  Diagnosis Date   Asthma    bronchial-well controlled   Cancer (HCC)    Bladder   Family history of adverse reaction to anesthesia    mother has a hard time waking up from Anesthesia   GERD (gastroesophageal reflux disease)    mild, on no preventative meds   Hypertension    Pre-diabetes    will be started on meds likely at next md vist, A1C 8.8    Surgical History: Past Surgical History:  Procedure Laterality Date   BUNIONECTOMY Right 2014   COLONOSCOPY  2009,2014,2019   CYSTOSCOPY W/ RETROGRADES Bilateral 04/28/2018   Procedure: CYSTOSCOPY WITH RETROGRADE PYELOGRAM;  Surgeon: Sherrilee Belvie LITTIE, MD;  Location: [facility];  Service: Urology;  Laterality: Bilateral;   NASAL SINUS SURGERY  1990s   TRANSURETHRAL RESECTION OF BLADDER TUMOR N/A 05/30/2018   Procedure: TRANSURETHRAL RESECTION OF BLADDER TUMOR (TURBT);  Surgeon: Sherrilee Belvie LITTIE, MD;  Location: [facility];  Service: Urology;  Laterality: N/A;  30 MINS   TRANSURETHRAL RESECTION OF BLADDER TUMOR WITH MITOMYCIN -C N/A 04/28/2018   Procedure: TRANSURETHRAL RESECTION OF BLADDER TUMOR WITH MITOMYCIN -C;  Surgeon: Sherrilee Belvie LITTIE, MD;  Location: [facility];  Service: Urology;  Laterality: N/A;    Home Medications:  Allergies as of 03/10/2023       Reactions   Augmentin [amoxicillin-pot Clavulanate] Diarrhea, Nausea And Vomiting   Did it involve swelling of the face/tongue/throat, SOB, or low BP? No Did it involve sudden or severe rash/hives, skin peeling, or any reaction on the inside of your mouth or nose?  No Did you need to seek medical attention at a hospital or doctor's office? No When did it last happen? 3-4 years ago      If all above answers are "NO", may proceed with cephalosporin use.        Medication List        Accurate as of March 10, 2023 11:01 AM. If you have any questions, ask your nurse or doctor.          STOP taking these medications    simvastatin 40 MG tablet Commonly known as: ZOCOR       TAKE these medications    alfuzosin  10 MG 24 hr tablet Commonly known as: UROXATRAL  TAKE 1 TABLET BY MOUTH  EVERY NIGHT AT BEDTIME   ascorbic acid 500 MG tablet Commonly known as: VITAMIN C Take 500 mg by mouth daily.   budesonide-formoterol 160-4.5 MCG/ACT inhaler Commonly known as: SYMBICORT Inhale 2 puffs into the lungs 2 (two) times daily.   cholecalciferol 25 MCG (1000 UNIT) tablet Commonly known as: VITAMIN D3 Take 1,000 Units by mouth daily.   docusate sodium 100 MG capsule Commonly known as: COLACE Take 100 mg by mouth 2 (two) times daily.   fluticasone 50 MCG/ACT nasal spray Commonly known as: FLONASE Place 2 sprays into both nostrils 2 (two) times daily.   gabapentin 100 MG capsule Commonly known as: NEURONTIN Take 100-200 mg by mouth at bedtime as needed.   hydroxypropyl methylcellulose / hypromellose 2.5 %  ophthalmic solution Commonly known as: ISOPTO TEARS / GONIOVISC Place 1 drop into both eyes 4 (four) times daily as needed for dry eyes.   Lifitegrast 5 % Soln Place 2 drops into both eyes 2 (two) times daily.   lisinopril 5 MG tablet Commonly known as: ZESTRIL Take 5 mg by mouth daily.   Lutein 20 MG Caps Take 1 capsule by mouth daily.   metFORMIN 750 MG 24 hr tablet Commonly known as: GLUCOPHAGE-XR Take 750 mg by mouth daily.   montelukast 10 MG tablet Commonly known as: SINGULAIR Take 10 mg by mouth at bedtime.   multivitamin with minerals Tabs tablet Take 1 tablet by mouth daily.   OneTouch Verio test  strip Generic drug: glucose blood 1 each daily.   phenazopyridine  100 MG tablet Commonly known as: Pyridium  Take 1 tablet (100 mg total) by mouth 3 (three) times daily as needed for pain.   polyethylene glycol powder 17 GM/SCOOP powder Commonly known as: GLYCOLAX/MIRALAX Take 17 g by mouth daily as needed (constipation).   Restasis 0.05 % ophthalmic emulsion Generic drug: cycloSPORINE   rosuvastatin 10 MG tablet Commonly known as: CRESTOR Take 10 mg by mouth daily.   silodosin  8 MG Caps capsule Commonly known as: RAPAFLO  Take 1 capsule (8 mg total) by mouth daily with breakfast.   Systane Nighttime Oint Place 1 application into both eyes at bedtime.        Allergies:  Allergies  Allergen Reactions   Augmentin [Amoxicillin-Pot Clavulanate] Diarrhea and Nausea And Vomiting    Did it involve swelling of the face/tongue/throat, SOB, or low BP? No Did it involve sudden or severe rash/hives, skin peeling, or any reaction on the inside of your mouth or nose? No Did you need to seek medical attention at a hospital or doctor's office? No When did it last happen? 3-4 years ago      If all above answers are "NO", may proceed with cephalosporin use.     Family History: No family history on file.  Social History:  reports that he quit smoking about 40 years ago. His smoking use included cigarettes. He started smoking about 54 years ago. He has a 3.5 pack-year smoking history. He quit smokeless tobacco use about 55 years ago.  His smokeless tobacco use included chew. He reports that he does not use drugs. No history on file for alcohol use.  ROS: All other review of systems were reviewed and are negative except what is noted above in HPI  Physical Exam: BP 129/81   Pulse 67   Constitutional:  Alert and oriented, No acute distress. HEENT: [place] AT, moist mucus membranes.  Trachea midline, no masses. Cardiovascular: No clubbing, cyanosis, or edema. Respiratory: Normal respiratory  effort, no increased work of breathing. GI: Abdomen is soft, nontender, nondistended, no abdominal masses GU: No CVA tenderness.  Lymph: No cervical or inguinal lymphadenopathy. Skin: No rashes, bruises or suspicious lesions. Neurologic: Grossly intact, no focal deficits, moving all 4 extremities. Psychiatric: Normal mood and affect.  Laboratory Data: No results found for: WBC, HGB, HCT, MCV, PLT  Lab Results  Component Value Date   CREATININE 0.66 04/28/2018    No results found for: PSA  No results found for: TESTOSTERONE  No results found for: HGBA1C  Urinalysis    Component Value Date/Time   APPEARANCEUR Clear 03/04/2022 0935   GLUCOSEU Negative 03/04/2022 0935   BILIRUBINUR Negative 03/04/2022 0935   PROTEINUR Negative 03/04/2022 0935   NITRITE Negative 03/04/2022 0935  LEUKOCYTESUR Negative 03/04/2022 0935    Lab Results  Component Value Date   LABMICR Comment 03/04/2022    Pertinent Imaging:  No results found for this or any previous visit.  No results found for this or any previous visit.  No results found for this or any previous visit.  No results found for this or any previous visit.  No results found for this or any previous visit.  No results found for this or any previous visit.  No results found for this or any previous visit.  No results found for this or any previous visit.    Cystoscopy Procedure Note  Patient identification was confirmed, informed consent was obtained, and patient was prepped using Betadine solution.  Lidocaine  jelly was administered per urethral meatus.     Pre-Procedure: - Inspection reveals a normal caliber ureteral meatus.  Procedure: The flexible cystoscope was introduced without difficulty - No urethral strictures/lesions are present. - Enlarged prostate  - Normal bladder neck - Bilateral ureteral orifices identified - Bladder mucosa  reveals no ulcers, tumors, or lesions - No bladder  stones - No trabeculation    Post-Procedure: - Patient tolerated the procedure well    Assessment & Plan:    1. Malignant neoplasm of urinary bladder, unspecified site (HCC) (Primary) Followup 1 year for cystoscopy - Cystoscopy (Bedside) - ciprofloxacin  (CIPRO ) tablet 500 mg - Urinalysis, Routine w reflex microscopic  2. Benign prostatic hyperplasia with urinary obstruction Uroxatral  10mg  qhs  3. Nocturia Uroxatral  10mg  qhs   No follow-ups on file.  Belvie Clara, MD  [facility] [place]

## 2023-03-15 ENCOUNTER — Encounter: Payer: Self-pay | Admitting: Urology

## 2023-03-15 NOTE — Patient Instructions (Signed)

## 2023-04-06 DIAGNOSIS — E119 Type 2 diabetes mellitus without complications: Secondary | ICD-10-CM | POA: Diagnosis not present

## 2023-04-06 DIAGNOSIS — H04123 Dry eye syndrome of bilateral lacrimal glands: Secondary | ICD-10-CM | POA: Diagnosis not present

## 2023-04-06 DIAGNOSIS — H2513 Age-related nuclear cataract, bilateral: Secondary | ICD-10-CM | POA: Diagnosis not present

## 2023-04-10 ENCOUNTER — Ambulatory Visit (HOSPITAL_BASED_OUTPATIENT_CLINIC_OR_DEPARTMENT_OTHER): Payer: Self-pay

## 2023-04-10 DIAGNOSIS — R070 Pain in throat: Secondary | ICD-10-CM | POA: Diagnosis not present

## 2023-04-10 DIAGNOSIS — R0981 Nasal congestion: Secondary | ICD-10-CM | POA: Diagnosis not present

## 2023-04-10 DIAGNOSIS — R059 Cough, unspecified: Secondary | ICD-10-CM | POA: Diagnosis not present

## 2023-04-15 DIAGNOSIS — J40 Bronchitis, not specified as acute or chronic: Secondary | ICD-10-CM | POA: Diagnosis not present

## 2023-04-15 DIAGNOSIS — J329 Chronic sinusitis, unspecified: Secondary | ICD-10-CM | POA: Diagnosis not present

## 2023-05-31 DIAGNOSIS — K219 Gastro-esophageal reflux disease without esophagitis: Secondary | ICD-10-CM | POA: Diagnosis not present

## 2023-05-31 DIAGNOSIS — E119 Type 2 diabetes mellitus without complications: Secondary | ICD-10-CM | POA: Diagnosis not present

## 2023-06-17 DIAGNOSIS — D2371 Other benign neoplasm of skin of right lower limb, including hip: Secondary | ICD-10-CM | POA: Diagnosis not present

## 2023-06-17 DIAGNOSIS — M2012 Hallux valgus (acquired), left foot: Secondary | ICD-10-CM | POA: Diagnosis not present

## 2023-06-17 DIAGNOSIS — D2372 Other benign neoplasm of skin of left lower limb, including hip: Secondary | ICD-10-CM | POA: Diagnosis not present

## 2023-06-17 DIAGNOSIS — M792 Neuralgia and neuritis, unspecified: Secondary | ICD-10-CM | POA: Diagnosis not present

## 2023-06-17 DIAGNOSIS — M19071 Primary osteoarthritis, right ankle and foot: Secondary | ICD-10-CM | POA: Diagnosis not present

## 2023-08-02 DIAGNOSIS — E1142 Type 2 diabetes mellitus with diabetic polyneuropathy: Secondary | ICD-10-CM | POA: Diagnosis not present

## 2023-08-02 DIAGNOSIS — M19071 Primary osteoarthritis, right ankle and foot: Secondary | ICD-10-CM | POA: Diagnosis not present

## 2023-08-02 DIAGNOSIS — M2012 Hallux valgus (acquired), left foot: Secondary | ICD-10-CM | POA: Diagnosis not present

## 2023-08-31 DIAGNOSIS — L570 Actinic keratosis: Secondary | ICD-10-CM | POA: Diagnosis not present

## 2023-08-31 DIAGNOSIS — L820 Inflamed seborrheic keratosis: Secondary | ICD-10-CM | POA: Diagnosis not present

## 2023-08-31 DIAGNOSIS — L578 Other skin changes due to chronic exposure to nonionizing radiation: Secondary | ICD-10-CM | POA: Diagnosis not present

## 2023-08-31 DIAGNOSIS — L821 Other seborrheic keratosis: Secondary | ICD-10-CM | POA: Diagnosis not present

## 2023-09-24 DIAGNOSIS — H5712 Ocular pain, left eye: Secondary | ICD-10-CM | POA: Diagnosis not present

## 2023-09-24 DIAGNOSIS — H00014 Hordeolum externum left upper eyelid: Secondary | ICD-10-CM | POA: Diagnosis not present

## 2023-10-26 DIAGNOSIS — E782 Mixed hyperlipidemia: Secondary | ICD-10-CM | POA: Diagnosis not present

## 2023-10-26 DIAGNOSIS — E1169 Type 2 diabetes mellitus with other specified complication: Secondary | ICD-10-CM | POA: Diagnosis not present

## 2023-10-26 DIAGNOSIS — Z23 Encounter for immunization: Secondary | ICD-10-CM | POA: Diagnosis not present

## 2023-11-04 DIAGNOSIS — J309 Allergic rhinitis, unspecified: Secondary | ICD-10-CM | POA: Diagnosis not present

## 2023-11-26 DIAGNOSIS — L820 Inflamed seborrheic keratosis: Secondary | ICD-10-CM | POA: Diagnosis not present

## 2023-11-26 DIAGNOSIS — D2239 Melanocytic nevi of other parts of face: Secondary | ICD-10-CM | POA: Diagnosis not present

## 2024-03-08 ENCOUNTER — Other Ambulatory Visit: Payer: Medicare Other | Admitting: Urology

## 2024-05-10 ENCOUNTER — Other Ambulatory Visit: Admitting: Urology
